# Patient Record
Sex: Male | Born: 1980 | Race: White | Hispanic: No | Marital: Married | Smoking: Never smoker
Health system: Southern US, Community
[De-identification: ages and names within clinical notes are randomized; demographics above are authoritative.]

## PROBLEM LIST (undated history)

## (undated) DIAGNOSIS — R011 Cardiac murmur, unspecified: Secondary | ICD-10-CM

## (undated) HISTORY — PX: HAND SURGERY: SHX662

## (undated) HISTORY — PX: APPENDECTOMY: SHX54

## (undated) HISTORY — PX: HAND TENDON SURGERY: SHX663

---

## 2005-08-24 ENCOUNTER — Emergency Department: Payer: Self-pay | Admitting: Unknown Physician Specialty

## 2006-11-18 ENCOUNTER — Emergency Department: Payer: Self-pay | Admitting: Unknown Physician Specialty

## 2007-09-06 ENCOUNTER — Emergency Department: Payer: Self-pay | Admitting: Emergency Medicine

## 2007-12-31 ENCOUNTER — Emergency Department: Payer: Self-pay | Admitting: Internal Medicine

## 2013-11-11 ENCOUNTER — Emergency Department: Payer: Self-pay | Admitting: Emergency Medicine

## 2014-01-22 ENCOUNTER — Emergency Department: Payer: Self-pay | Admitting: Emergency Medicine

## 2015-04-26 ENCOUNTER — Encounter: Payer: Self-pay | Admitting: Intensive Care

## 2015-04-26 ENCOUNTER — Emergency Department: Payer: Managed Care, Other (non HMO)

## 2015-04-26 ENCOUNTER — Emergency Department
Admission: EM | Admit: 2015-04-26 | Discharge: 2015-04-26 | Disposition: A | Discharge status: Home / Self Care | Payer: Managed Care, Other (non HMO) | Attending: Emergency Medicine | Admitting: Emergency Medicine

## 2015-04-26 DIAGNOSIS — Y9389 Activity, other specified: Secondary | ICD-10-CM | POA: Diagnosis not present

## 2015-04-26 DIAGNOSIS — Y9289 Other specified places as the place of occurrence of the external cause: Secondary | ICD-10-CM | POA: Insufficient documentation

## 2015-04-26 DIAGNOSIS — Y990 Civilian activity done for income or pay: Secondary | ICD-10-CM | POA: Diagnosis not present

## 2015-04-26 DIAGNOSIS — S6992XA Unspecified injury of left wrist, hand and finger(s), initial encounter: Secondary | ICD-10-CM | POA: Diagnosis present

## 2015-04-26 DIAGNOSIS — W109XXA Fall (on) (from) unspecified stairs and steps, initial encounter: Secondary | ICD-10-CM | POA: Insufficient documentation

## 2015-04-26 DIAGNOSIS — S60222A Contusion of left hand, initial encounter: Secondary | ICD-10-CM | POA: Insufficient documentation

## 2015-04-26 MED ORDER — IBUPROFEN 800 MG PO TABS: 800.0000 mg | ORAL_TABLET | Freq: Three times a day (TID) | ORAL | Status: DC | PRN

## 2015-04-26 NOTE — ED Notes (Signed)
Patient reports "I almost fell down the steps at work. I caught myself with my Left hand." Left hand is swollen with slight redness. Patient ambulated to rm with NAD noted

## 2015-04-26 NOTE — Discharge Instructions (Signed)
Hand Contusion  A hand contusion is a deep bruise on your hand area. Contusions are the result of an injury that caused bleeding under the skin. The contusion may turn blue, purple, or yellow. Minor injuries will give you a painless contusion, but more severe contusions may stay painful and swollen for a few weeks.  CAUSES   A contusion is usually caused by a blow, trauma, or direct force to an area of the body.  SYMPTOMS    Swelling and redness of the injured area.   Discoloration of the injured area.   Tenderness and soreness of the injured area.   Pain.  DIAGNOSIS   The diagnosis can be made by taking a history and performing a physical exam. An X-ray, CT scan, or MRI may be needed to determine if there were any associated injuries, such as broken bones (fractures).  TREATMENT   Often, the best treatment for a hand contusion is resting, elevating, icing, and applying cold compresses to the injured area. Over-the-counter medicines may also be recommended for pain control.  HOME CARE INSTRUCTIONS    Put ice on the injured area.    Put ice in a plastic bag.    Place a towel between your skin and the bag.    Leave the ice on for 15-20 minutes, 03-04 times a day.   Only take over-the-counter or prescription medicines as directed by your caregiver. Your caregiver may recommend avoiding anti-inflammatory medicines (aspirin, ibuprofen, and naproxen) for 48 hours because these medicines may increase bruising.   If told, use an elastic wrap as directed. This can help reduce swelling. You may remove the wrap for sleeping, showering, and bathing. If your fingers become numb, cold, or blue, take the wrap off and reapply it more loosely.   Elevate your hand with pillows to reduce swelling.   Avoid overusing your hand if it is painful.  SEEK IMMEDIATE MEDICAL CARE IF:    You have increased redness, swelling, or pain in your hand.   Your swelling or pain is not relieved with medicines.   You have loss of feeling in  your hand or are unable to move your fingers.   Your hand turns cold or blue.   You have pain when you move your fingers.   Your hand becomes warm to the touch.   Your contusion does not improve in 2 days.  MAKE SURE YOU:    Understand these instructions.   Will watch your condition.   Will get help right away if you are not doing well or get worse.     This information is not intended to replace advice given to you by your health care provider. Make sure you discuss any questions you have with your health care provider.     Document Released: 12/12/2001 Document Revised: 03/16/2012 Document Reviewed: 12/14/2011  Elsevier Interactive Patient Education 2016 Elsevier Inc[employer]

## 2015-04-26 NOTE — ED Provider Notes (Signed)
[facility] Emergency Department Provider Note  ____________________________________________  Time seen: Approximately 9:37 AM  I have reviewed the triage vital signs and the nursing notes.   HISTORY  Chief Complaint Hand Injury    HPI Riley Oconnell is a 34 y.o. male who presents for evaluation of left hand pain. Patient states that he almost fell down the steps at work caught his left hand on the railing. Complains of increased swelling and redness to the hand.   History reviewed. No pertinent past medical history.  There are no active problems to display for this patient.   Past Surgical History  Procedure Laterality Date  . Hand tendon surgery Right     Current Outpatient Rx  Name  Route  Sig  Dispense  Refill  . ibuprofen (ADVIL,MOTRIN) 800 MG tablet   Oral   Take 1 tablet (800 mg total) by mouth every 8 (eight) hours as needed.   30 tablet   0     Allergies Review of patient's allergies indicates no known allergies.  History reviewed. No pertinent family history.  Social History Social History  Substance Use Topics  . Smoking status: Never Smoker   . Smokeless tobacco: Never Used  . Alcohol Use: 3.6 oz/week    6 Cans of beer per week     Comment: mixture of beer and liquor. About two drinks everyother day    Review of Systems Constitutional: No fever/chills Eyes: No visual changes. ENT: No sore throat. Cardiovascular: Denies chest pain. Respiratory: Denies shortness of breath. Gastrointestinal: No abdominal pain.  No nausea, no vomiting.  No diarrhea.  No constipation. Genitourinary: Negative for dysuria. Musculoskeletal: Positive for left hand pain. Skin: Negative for rash. Neurological: Negative for headaches, focal weakness or numbness.  10-point ROS otherwise negative.  ____________________________________________   PHYSICAL EXAM:  VITAL SIGNS: ED Triage Vitals  Enc Vitals Group     BP 04/26/15 0923  128/84 mmHg     Pulse Rate 04/26/15 0923 72     Resp 04/26/15 0923 12     Temp 04/26/15 0923 98.6 F (37 C)     Temp Source 04/26/15 0923 Oral     SpO2 04/26/15 0923 97 %     Weight 04/26/15 0923 157 lb (71.215 kg)     Height 04/26/15 0923 5\' 9"  (1.753 m)     Head Cir --      Peak Flow --      Pain Score 04/26/15 0924 7     Pain Loc --      Pain Edu? --      Excl. in GC? --     Constitutional: Alert and oriented. Well appearing and in no acute distress. Musculoskeletal: Positive dorsal left hand edema with erythema noted. Point tenderness. Range of motion with decreased strength noted. Neurologic:  Normal speech and language. No gross focal neurologic deficits are appreciated. No gait instability. Skin:  Skin is warm, dry and intact. No rash noted. Psychiatric: Mood and affect are normal. Speech and behavior are normal.  ____________________________________________   LABS (all labs ordered are listed, but only abnormal results are displayed)  Labs Reviewed - No data to display  RADIOLOGY  Negative for fracture. Interpreted by radiologist and reviewed by myself. ____________________________________________   PROCEDURES  Procedure(s) performed: None  Critical Care performed: No  ____________________________________________   INITIAL IMPRESSION / ASSESSMENT AND PLAN / ED COURSE  Pertinent labs & imaging results that were available during my care of the patient  were reviewed by me and considered in my medical decision making (see chart for details).  Left hand contusion. Rx provided for Motrin 800 mg 3 times a day. Reassurance provided Ace wrap given. Patient follow-up PCP or return to ER with any worsening symptomology. ____________________________________________   FINAL CLINICAL IMPRESSION(S) / ED DIAGNOSES  Final diagnoses:  Hand contusion, left, initial encounter      Evangeline Dakin, PA-C 04/26/15 1021  Sharyn Creamer, MD 04/26/15 1546

## 2015-05-13 ENCOUNTER — Emergency Department
Admission: EM | Admit: 2015-05-13 | Discharge: 2015-05-13 | Disposition: A | Discharge status: Home / Self Care | Payer: Managed Care, Other (non HMO) | Attending: Emergency Medicine | Admitting: Emergency Medicine

## 2015-05-13 ENCOUNTER — Encounter: Payer: Self-pay | Admitting: Emergency Medicine

## 2015-05-13 ENCOUNTER — Emergency Department: Payer: Managed Care, Other (non HMO)

## 2015-05-13 DIAGNOSIS — M79642 Pain in left hand: Secondary | ICD-10-CM | POA: Diagnosis present

## 2015-05-13 MED ORDER — IBUPROFEN 600 MG PO TABS: ORAL_TABLET | ORAL | Status: DC

## 2015-05-13 NOTE — ED Notes (Addendum)
Pt seen here on 10/21 for hand pain resulting from injury the day before; pt says xray's were negative for fracture; returns tonight with continued pain; swelling noted to the back of the hand which pt says has improved from last visit; pt says he was prescribed ibuprofen for the pain and takes it "when it really hurts", but hasn't taken any since Saturday morning

## 2015-05-13 NOTE — Discharge Instructions (Signed)
Your repeat x-rays were reassuring and did not show any evidence of injury to the bones.  We recommend that you use your wrist splint as needed for comfort and follow up with a bone specialist to see if there are an additional treatment that may be benefit to you.  Use regular over-the-counter ibuprofen (600 mg) three times a day with meals, or use the prescribed ibuprofen.  You may also use over-the-counter Tylenol as needed according to the label instructions.   Musculoskeletal Pain Musculoskeletal pain is muscle and boney aches and pains. These pains can occur in any part of the body. Your caregiver may treat you without knowing the cause of the pain. They may treat you if blood or urine tests, X-rays, and other tests were normal.  CAUSES There is often not a definite cause or reason for these pains. These pains may be caused by a type of germ (virus). The discomfort may also come from overuse. Overuse includes working out too hard when your body is not fit. Boney aches also come from weather changes. Bone is sensitive to atmospheric pressure changes. HOME CARE INSTRUCTIONS   Ask when your test results will be ready. Make sure you get your test results.  Only take over-the-counter or prescription medicines for pain, discomfort, or fever as directed by your caregiver. If you were given medications for your condition, do not drive, operate machinery or power tools, or sign legal documents for 24 hours. Do not drink alcohol. Do not take sleeping pills or other medications that may interfere with treatment.  Continue all activities unless the activities cause more pain. When the pain lessens, slowly resume normal activities. Gradually increase the intensity and duration of the activities or exercise.  During periods of severe pain, bed rest may be helpful. Lay or sit in any position that is comfortable.  Putting ice on the injured area.  Put ice in a bag.  Place a towel between your skin and the  bag.  Leave the ice on for 15 to 20 minutes, 3 to 4 times a day.  Follow up with your caregiver for continued problems and no reason can be found for the pain. If the pain becomes worse or does not go away, it may be necessary to repeat tests or do additional testing. Your caregiver may need to look further for a possible cause. SEEK IMMEDIATE MEDICAL CARE IF:  You have pain that is getting worse and is not relieved by medications.  You develop chest pain that is associated with shortness or breath, sweating, feeling sick to your stomach (nauseous), or throw up (vomit).  Your pain becomes localized to the abdomen.  You develop any new symptoms that seem different or that concern you. MAKE SURE YOU:   Understand these instructions.  Will watch your condition.  Will get help right away if you are not doing well or get worse.   This information is not intended to replace advice given to you by your health care provider. Make sure you discuss any questions you have with your health care provider.   Document Released: 06/22/2005 Document Revised: 09/14/2011 Document Reviewed: 02/24/2013 Elsevier Interactive Patient Education [employer]2016 Elsevier Inc.

## 2015-05-13 NOTE — ED Provider Notes (Signed)
[facility] Emergency Department Provider Note  ____________________________________________  Time seen: Approximately 3:19 AM  I have reviewed the triage vital signs and the nursing notes.   HISTORY  Chief Complaint Hand Pain    HPI Riley Oconnell is a 34 y.o. male with no significant past medical history other than the hand injury which originally occurred about 2 weeks ago.  He originally presented the day after injuring his left hand and had negative x-rays and was prescribed ibuprofen which did help to relieve the pain.  However, it is been about 2 weeks and he is still having persistent pain with any movement of the hand.  It is slightly swollen around the area of his third metacarpal/MCP jointand he has significant discomfort with flexion and extension of his fingers.  He does not have any weakness.  He denies any other symptoms.  The pain is moderate and helped with ibuprofen.  He is right-hand dominant.   History reviewed. No pertinent past medical history.  There are no active problems to display for this patient.   Past Surgical History  Procedure Laterality Date  . Hand tendon surgery Right     Current Outpatient Rx  Name  Route  Sig  Dispense  Refill  . ibuprofen (ADVIL,MOTRIN) 600 MG tablet      Take 1 tablet by mouth three times daily with meals   15 tablet   0     Allergies Review of patient's allergies indicates no known allergies.  History reviewed. No pertinent family history.  Social History Social History  Substance Use Topics  . Smoking status: Never Smoker   . Smokeless tobacco: Never Used  . Alcohol Use: 3.6 oz/week    6 Cans of beer per week     Comment: mixture of beer and liquor. About two drinks everyother day    Review of Systems Constitutional: No fever/chills Eyes: No visual changes. ENT: No sore throat. Cardiovascular: Denies chest pain. Respiratory: Denies shortness of breath. Gastrointestinal: No  abdominal pain.  No nausea, no vomiting.  No diarrhea.  No constipation. Genitourinary: Negative for dysuria. Musculoskeletal: pain with use of left hand Neurological: Negative for headaches, focal weakness or numbness.  ____________________________________________   PHYSICAL EXAM:  VITAL SIGNS: ED Triage Vitals  Enc Vitals Group     BP 05/13/15 0212 120/91 mmHg     Pulse Rate 05/13/15 0212 65     Resp 05/13/15 0212 18     Temp 05/13/15 0212 98.2 F (36.8 C)     Temp Source 05/13/15 0212 Oral     SpO2 05/13/15 0212 99 %     Weight 05/13/15 0212 157 lb (71.215 kg)     Height 05/13/15 0212 5\' 9"  (1.753 m)     Head Cir --      Peak Flow --      Pain Score 05/13/15 0213 7     Pain Loc --      Pain Edu? --      Excl. in GC? --     Constitutional: Alert and oriented. Well appearing and in no acute distress. Eyes: Conjunctivae are normal. PERRL. EOMI. Head: Atraumatic. Cardiovascular: Normal rate, regular rhythm.  Respiratory: Normal respiratory effort.  No retractions.  Musculoskeletal: Swelling around the 3rd MCP of left hand w/ tenderness/pain w/ palpation and flexion/extension of middle finger Neurologic:  Normal speech and language. No gross focal neurologic deficits are appreciated.  Skin:  Skin is warm, dry and intact. No rash noted.  Psychiatric: Mood and affect are normal. Speech and behavior are normal.  ____________________________________________   LABS (all labs ordered are listed, but only abnormal results are displayed)  Labs Reviewed - No data to display ____________________________________________  EKG  Not indicated ____________________________________________  RADIOLOGY   Dg Hand Complete Left  05/13/2015  CLINICAL DATA:  Persistent pain about third metacarpal after injury 2 weeks prior. EXAM: LEFT HAND - COMPLETE 3+ VIEW COMPARISON:  Radiographs 04/26/2015 FINDINGS: Examination compared to prior exam. No acute or healing fracture, particularly of  the third metacarpal. No periosteal reaction. Questionable carpal bowel boss. The alignment and joint spaces are maintained. There is mild dorsal soft tissue edema. IMPRESSION: No acute or healing fracture, with particular attention to the third metacarpal. Electronically Signed   By: Rubye Oaks M.D.   On: 05/13/2015 03:42    ____________________________________________   PROCEDURES  Procedure(s) performed: None  Critical Care performed: No ____________________________________________   INITIAL IMPRESSION / ASSESSMENT AND PLAN / ED COURSE  Pertinent labs & imaging results that were available during my care of the patient were reviewed by me and considered in my medical decision making (see chart for details).  Will repeat x-rays given persistent pain and mild deformity to assess new bone growth that may indicate a subtle fracture not originally seen on radiographs immediately post-injury.  ____________________________________________  FINAL CLINICAL IMPRESSION(S) / ED DIAGNOSES  Final diagnoses:  Left hand pain      NEW MEDICATIONS STARTED DURING THIS VISIT:  New Prescriptions   IBUPROFEN (ADVIL,MOTRIN) 600 MG TABLET    Take 1 tablet by mouth three times daily with meals     Loleta Rose, MD 05/13/15 [phone]

## 2015-09-16 ENCOUNTER — Encounter: Payer: Self-pay | Admitting: Emergency Medicine

## 2015-09-16 ENCOUNTER — Emergency Department
Admission: EM | Admit: 2015-09-16 | Discharge: 2015-09-16 | Disposition: A | Discharge status: Home / Self Care | Payer: Managed Care, Other (non HMO) | Attending: Emergency Medicine | Admitting: Emergency Medicine

## 2015-09-16 DIAGNOSIS — R05 Cough: Secondary | ICD-10-CM | POA: Diagnosis present

## 2015-09-16 DIAGNOSIS — J069 Acute upper respiratory infection, unspecified: Secondary | ICD-10-CM | POA: Insufficient documentation

## 2015-09-16 DIAGNOSIS — Z791 Long term (current) use of non-steroidal anti-inflammatories (NSAID): Secondary | ICD-10-CM | POA: Diagnosis not present

## 2015-09-16 MED ORDER — CETIRIZINE HCL 10 MG PO TABS: 10.0000 mg | ORAL_TABLET | Freq: Every day | ORAL | Status: DC

## 2015-09-16 MED ORDER — FLUTICASONE PROPIONATE 50 MCG/ACT NA SUSP: 1.0000 | Freq: Two times a day (BID) | NASAL | Status: DC

## 2015-09-16 MED ORDER — MAGIC MOUTHWASH W/LIDOCAINE: 5.0000 mL | Freq: Four times a day (QID) | ORAL | Status: DC

## 2015-09-16 MED ORDER — BENZONATATE 200 MG PO CAPS: 200.0000 mg | ORAL_CAPSULE | Freq: Three times a day (TID) | ORAL | Status: DC | PRN

## 2015-09-16 NOTE — ED Notes (Signed)
Pt reports cough, body aches, congestion, "sniffles", fever of 100 over weekend, and sore throat.  No acute distress in triage.

## 2015-09-16 NOTE — ED Provider Notes (Signed)
[facility] Emergency Department Provider Note  ____________________________________________  Time seen: Approximately 11:14 PM  I have reviewed the triage vital signs and the nursing notes.   HISTORY  Chief Complaint Generalized Body Aches    HPI Riley Oconnell is a 35 y.o. male who presents emergency department complaining of upper respiratory symptoms to include cough, body aches, nasal congestion, fever, sore throat 4 days. Patient has been taking NyQuil with some relief. Patient denies any headache, visual acuity changes, neck pain, chest pain, shortness of breath, difficult breathing or swelling, abdominal pain, nausea or vomiting.   History reviewed. No pertinent past medical history.  There are no active problems to display for this patient.   Past Surgical History  Procedure Laterality Date  . Hand tendon surgery Right     Current Outpatient Rx  Name  Route  Sig  Dispense  Refill  . benzonatate (TESSALON) 200 MG capsule   Oral   Take 1 capsule (200 mg total) by mouth 3 (three) times daily as needed for cough.   21 capsule   0   . cetirizine (ZYRTEC) 10 MG tablet   Oral   Take 1 tablet (10 mg total) by mouth daily.   30 tablet   0   . fluticasone (FLONASE) 50 MCG/ACT nasal spray   Each Nare   Place 1 spray into both nostrils 2 (two) times daily.   16 g   0   . ibuprofen (ADVIL,MOTRIN) 600 MG tablet      Take 1 tablet by mouth three times daily with meals   15 tablet   0   . magic mouthwash w/lidocaine SOLN   Oral   Take 5 mLs by mouth 4 (four) times daily.   240 mL   0     Dispense in a 1/1/1/1 ratio. Use lidocaine, diphen ...     Allergies Review of patient's allergies indicates no known allergies.  History reviewed. No pertinent family history.  Social History Social History  Substance Use Topics  . Smoking status: Never Smoker   . Smokeless tobacco: Never Used  . Alcohol Use: 3.6 oz/week    6 Cans of  beer per week     Comment: mixture of beer and liquor. About two drinks everyother day     Review of Systems  Constitutional: Positive fever/chills Eyes: No visual changes. No discharge ENT: Positive sore throat. Does admit nasal congestion. Cardiovascular: no chest pain. Respiratory: Positive cough. No SOB. Skin: Negative for rash. Neurological: Negative for headaches, focal weakness or numbness. 10-point ROS otherwise negative.  ____________________________________________   PHYSICAL EXAM:  VITAL SIGNS: ED Triage Vitals  Enc Vitals Group     BP 09/16/15 2125 139/80 mmHg     Pulse Rate 09/16/15 2125 78     Resp 09/16/15 2125 16     Temp 09/16/15 2125 98.4 F (36.9 C)     Temp Source 09/16/15 2125 Oral     SpO2 09/16/15 2125 97 %     Weight 09/16/15 2125 155 lb (70.308 kg)     Height 09/16/15 2125  (1.753 m)     Head Cir --      Peak Flow --      Pain Score 09/16/15 2125 7     Pain Loc --      Pain Edu? --      Excl. in GC? --      Constitutional: Alert and oriented. Well appearing and in no acute  distress. Eyes: Conjunctivae are normal. PERRL. EOMI. Head: Atraumatic. ENT:      Ears: EACs and TMs are unremarkable bilaterally.      Nose: Moderate clear congestion/rhinnorhea.      Mouth/Throat: Mucous membranes are moist. Oropharynx is nonerythematous and nonedematous. Tonsils are mildly erythematous but nonedematous and no exudates present. Neck: No stridor.   Hematological/Lymphatic/Immunilogical: No cervical lymphadenopathy. Cardiovascular: Normal rate, regular rhythm. Normal S1 and S2.  Good peripheral circulation. Respiratory: Normal respiratory effort without tachypnea or retractions. Lungs CTAB. Neurologic:  Normal speech and language. No gross focal neurologic deficits are appreciated.  Skin:  Skin is warm, dry and intact. No rash noted. Psychiatric: Mood and affect are normal. Speech and behavior are normal. Patient exhibits appropriate insight and  judgement.   ____________________________________________   LABS (all labs ordered are listed, but only abnormal results are displayed)  Labs Reviewed - No data to display ____________________________________________  EKG   ____________________________________________  RADIOLOGY   No results found.  ____________________________________________    PROCEDURES  Procedure(s) performed:       Medications - No data to display   ____________________________________________   INITIAL IMPRESSION / ASSESSMENT AND PLAN / ED COURSE  Pertinent labs & imaging results that were available during my care of the patient were reviewed by me and considered in my medical decision making (see chart for details).  Patient's diagnosis is consistent with prior lower respiratory illness.. Patient will be discharged home with prescriptions for symptom control. Patient is to follow up with primary care provider if symptoms persist past this treatment course. Patient is given ED precautions to return to the ED for any worsening or new symptoms.     ____________________________________________  FINAL CLINICAL IMPRESSION(S) / ED DIAGNOSES  Final diagnoses:  Viral upper respiratory illness      NEW MEDICATIONS STARTED DURING THIS VISIT:  Discharge Medication List as of 09/16/2015 11:26 PM    START taking these medications   Details  benzonatate (TESSALON) 200 MG capsule Take 1 capsule (200 mg total) by mouth 3 (three) times daily as needed for cough., Starting 09/16/2015, Until Discontinued, Print    cetirizine (ZYRTEC) 10 MG tablet Take 1 tablet (10 mg total) by mouth daily., Starting 09/16/2015, Until Discontinued, Print    fluticasone (FLONASE) 50 MCG/ACT nasal spray Place 1 spray into both nostrils 2 (two) times daily., Starting 09/16/2015, Until Discontinued, Print    magic mouthwash w/lidocaine SOLN Take 5 mLs by mouth 4 (four) times daily., Starting 09/16/2015, Until  Discontinued, Print            This chart was dictated using voice recognition software/Dragon. Despite best efforts to proofread, errors can occur which can change the meaning. Any change was purely unintentional.    Racheal PatchesJonathan D Cuthriell, PA-C 09/17/15 0004  Arnaldo NatalPaul F Malinda, MD 09/17/15 [phone]

## 2015-09-16 NOTE — Discharge Instructions (Signed)
Upper Respiratory Infection, Adult Most upper respiratory infections (URIs) are a viral infection of the air passages leading to the lungs. A URI affects the nose, throat, and upper air passages. The most common type of URI is nasopharyngitis and is typically referred to as "the common cold." URIs run their course and usually go away on their own. Most of the time, a URI does not require medical attention, but sometimes a bacterial infection in the upper airways can follow a viral infection. This is called a secondary infection. Sinus and middle ear infections are common types of secondary upper respiratory infections. Bacterial pneumonia can also complicate a URI. A URI can worsen asthma and chronic obstructive pulmonary disease (COPD). Sometimes, these complications can require emergency medical care and may be life threatening.  CAUSES Almost all URIs are caused by viruses. A virus is a type of germ and can spread from one person to another.  RISKS FACTORS You may be at risk for a URI if:   You smoke.   You have chronic heart or lung disease.  You have a weakened defense (immune) system.   You are very young or very old.   You have nasal allergies or asthma.  You work in crowded or poorly ventilated areas.  You work in health care facilities or schools. SIGNS AND SYMPTOMS  Symptoms typically develop 2-3 days after you come in contact with a cold virus. Most viral URIs last 7-10 days. However, viral URIs from the influenza virus (flu virus) can last 14-18 days and are typically more severe. Symptoms may include:   Runny or stuffy (congested) nose.   Sneezing.   Cough.   Sore throat.   Headache.   Fatigue.   Fever.   Loss of appetite.   Pain in your forehead, behind your eyes, and over your cheekbones (sinus pain).  Muscle aches.  DIAGNOSIS  Your health care provider may diagnose a URI by:  Physical exam.  Tests to check that your symptoms are not due to  another condition such as:  Strep throat.  Sinusitis.  Pneumonia.  Asthma. TREATMENT  A URI goes away on its own with time. It cannot be cured with medicines, but medicines may be prescribed or recommended to relieve symptoms. Medicines may help:  Reduce your fever.  Reduce your cough.  Relieve nasal congestion. HOME CARE INSTRUCTIONS   Take medicines only as directed by your health care provider.   Gargle warm saltwater or take cough drops to comfort your throat as directed by your health care provider.  Use a warm mist humidifier or inhale steam from a shower to increase air moisture. This may make it easier to breathe.  Drink enough fluid to keep your urine clear or pale yellow.   Eat soups and other clear broths and maintain good nutrition.   Rest as needed.   Return to work when your temperature has returned to normal or as your health care provider advises. You may need to stay home longer to avoid infecting others. You can also use a face mask and careful hand washing to prevent spread of the virus.  Increase the usage of your inhaler if you have asthma.   Do not use any tobacco products, including cigarettes, chewing tobacco, or electronic cigarettes. If you need help quitting, ask your health care provider. PREVENTION  The best way to protect yourself from getting a cold is to practice good hygiene.   Avoid oral or hand contact with people with cold   symptoms.   Wash your hands often if contact occurs.  There is no clear evidence that vitamin C, vitamin E, echinacea, or exercise reduces the chance of developing a cold. However, it is always recommended to get plenty of rest, exercise, and practice good nutrition.  SEEK MEDICAL CARE IF:   You are getting worse rather than better.   Your symptoms are not controlled by medicine.   You have chills.  You have worsening shortness of breath.  You have brown or red mucus.  You have yellow or brown nasal  discharge.  You have pain in your face, especially when you bend forward.  You have a fever.  You have swollen neck glands.  You have pain while swallowing.  You have white areas in the back of your throat. SEEK IMMEDIATE MEDICAL CARE IF:   You have severe or persistent:  Headache.  Ear pain.  Sinus pain.  Chest pain.  You have chronic lung disease and any of the following:  Wheezing.  Prolonged cough.  Coughing up blood.  A change in your usual mucus.  You have a stiff neck.  You have changes in your:  Vision.  Hearing.  Thinking.  Mood. MAKE SURE YOU:   Understand these instructions.  Will watch your condition.  Will get help right away if you are not doing well or get worse.   This information is not intended to replace advice given to you by your health care provider. Make sure you discuss any questions you have with your health care provider.   Document Released: 12/16/2000 Document Revised: 11/06/2014 Document Reviewed: 09/27/2013 Elsevier Interactive Patient Education 2016 [employer]  

## 2015-11-15 ENCOUNTER — Encounter: Payer: Self-pay | Admitting: Medical Oncology

## 2015-11-15 ENCOUNTER — Emergency Department
Admission: EM | Admit: 2015-11-15 | Discharge: 2015-11-15 | Disposition: A | Discharge status: Home / Self Care | Payer: Managed Care, Other (non HMO) | Attending: Emergency Medicine | Admitting: Emergency Medicine

## 2015-11-15 DIAGNOSIS — A6002 Herpesviral infection of other male genital organs: Secondary | ICD-10-CM | POA: Diagnosis not present

## 2015-11-15 DIAGNOSIS — Z79899 Other long term (current) drug therapy: Secondary | ICD-10-CM | POA: Diagnosis not present

## 2015-11-15 DIAGNOSIS — R21 Rash and other nonspecific skin eruption: Secondary | ICD-10-CM | POA: Diagnosis present

## 2015-11-15 DIAGNOSIS — Z791 Long term (current) use of non-steroidal anti-inflammatories (NSAID): Secondary | ICD-10-CM | POA: Insufficient documentation

## 2015-11-15 DIAGNOSIS — A6000 Herpesviral infection of urogenital system, unspecified: Secondary | ICD-10-CM

## 2015-11-15 MED ORDER — VALACYCLOVIR HCL 1 G PO TABS: 1000.0000 mg | ORAL_TABLET | Freq: Three times a day (TID) | ORAL | Status: DC

## 2015-11-15 MED ORDER — TRIAMCINOLONE ACETONIDE 0.1 % EX CREA
1.0000 | TOPICAL_CREAM | Freq: Two times a day (BID) | CUTANEOUS | Status: DC
Start: 2015-11-15 — End: 2018-11-01

## 2015-11-15 NOTE — ED Notes (Signed)
Rash around rectum x 3 days.

## 2015-11-15 NOTE — ED Provider Notes (Signed)
[facility] Emergency Department Provider Note  ____________________________________________  Time seen: Approximately 8:14 AM  I have reviewed the triage vital signs and the nursing notes.   HISTORY  Chief Complaint Rash    HPI Riley Oconnell is a 35 y.o. male , NAD, presents to emergency for 3 day history of painful rash on his buttocks and rectum.States he had unprotected anal intercourse with another male approximately 2 weeks ago. States he was inebriated and uncertain if condoms were used. Noted red, painful rash about his rectum and buttocks over the last few days. Has not noted any oozing or weeping. Denies fever, chills, night sweats, body aches, diarrhea, abdominal pain, nausea, vomiting. Applied over-the-counter hemorrhoid cream which seemed to soothe some of the area but did not alleviate his symptoms. No new lotions, soaps, detergents. He recently restarted working out in a gym as well as tanning but notes no other rash about the rest of this body.   History reviewed. No pertinent past medical history.  There are no active problems to display for this patient.   Past Surgical History  Procedure Laterality Date  . Hand tendon surgery Right     Current Outpatient Rx  Name  Route  Sig  Dispense  Refill  . benzonatate (TESSALON) 200 MG capsule   Oral   Take 1 capsule (200 mg total) by mouth 3 (three) times daily as needed for cough.   21 capsule   0   . cetirizine (ZYRTEC) 10 MG tablet   Oral   Take 1 tablet (10 mg total) by mouth daily.   30 tablet   0   . fluticasone (FLONASE) 50 MCG/ACT nasal spray   Each Nare   Place 1 spray into both nostrils 2 (two) times daily.   16 g   0   . ibuprofen (ADVIL,MOTRIN) 600 MG tablet      Take 1 tablet by mouth three times daily with meals   15 tablet   0   . magic mouthwash w/lidocaine SOLN   Oral   Take 5 mLs by mouth 4 (four) times daily.   240 mL   0     Dispense in a 1/1/1/1  ratio. Use lidocaine, diphen ...   . triamcinolone cream (KENALOG) 0.1 %   Topical   Apply 1 application topically 2 (two) times daily.   30 g   0   . valACYclovir (VALTREX) 1000 MG tablet   Oral   Take 1 tablet (1,000 mg total) by mouth 3 (three) times daily.   30 tablet   0     Allergies Review of patient's allergies indicates no known allergies.  No family history on file.  Social History Social History  Substance Use Topics  . Smoking status: Never Smoker   . Smokeless tobacco: Never Used  . Alcohol Use: No     Comment: occasionan     Review of Systems  Constitutional: No fever, chills, fatigue, night sweats ENT: No sore throat. Cardiovascular: No chest pain. Respiratory: No shortness of breath.  Gastrointestinal: No abdominal pain.  No nausea, vomiting.  No diarrhea.  No constipation. No rectal pain Genitourinary: Negative for dysuria, urethral discharge Musculoskeletal: Negative for back pain.  Skin: Positive painful erythematous rash about the rectum and buttocks. No oozing, weeping.  Neurological: Negative for headaches, focal weakness or numbness. No saddle paresthesias. No loss of bowel or bladder control. 10-point ROS otherwise negative.  ____________________________________________   PHYSICAL EXAM:  VITAL SIGNS:  ED Triage Vitals  Enc Vitals Group     BP 11/15/15 0758 130/93 mmHg     Pulse Rate 11/15/15 0758 60     Resp 11/15/15 0758 18     Temp 11/15/15 0758 98.2 F (36.8 C)     Temp Source 11/15/15 0758 Oral     SpO2 11/15/15 0758 97 %     Weight 11/15/15 0758 155 lb (70.308 kg)     Height 11/15/15 0758 5\' 9"  (1.753 m)     Head Cir --      Peak Flow --      Pain Score 11/15/15 0759 7     Pain Loc --      Pain Edu? --      Excl. in GC? --    Physical exam completed in the presence of Marliss CzarLinda McLamb, RN.  Constitutional: Alert and oriented. Well appearing and in no acute distress. Eyes: Conjunctivae are normal.  Head:  Atraumatic. Cardiovascular:  Good peripheral circulation. Respiratory: Normal respiratory effort without tachypnea or retractions. Gastrointestinal: Multiple erythematous papules noted about the anus and perianal region. One open lesion is noted. No tenderness to palpation. No evidence of induration of any of the lesions. No evidence of external hemorrhoids. Musculoskeletal: No lower extremity tenderness nor edema.  No joint effusions. Neurologic:  Normal speech and language.  Skin:  Skin is warm, dry and intact.  Psychiatric: Mood and affect are normal. Speech and behavior are normal. Patient exhibits appropriate insight and judgement.   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 Patient will be discharged home with prescriptions for Valtrex to take as directed and triamcinolone cream to apply for symptomatic care. [facility] to [facility] or his primary care provider if symptoms persist past this treatment course. Patient also advised to have complete physical examination and STD workup with his primary care provider or the [facility]. Patient is given ED precautions to return to the ED for any worsening or new symptoms.      ____________________________________________  FINAL CLINICAL IMPRESSION(S) / ED DIAGNOSES  Final diagnoses:  Genital herpes      NEW MEDICATIONS STARTED DURING THIS VISIT:  New Prescriptions   TRIAMCINOLONE CREAM (KENALOG) 0.1 %    Apply 1 application  topically 2 (two) times daily.   VALACYCLOVIR (VALTREX) 1000 MG TABLET    Take 1 tablet (1,000 mg total) by mouth 3 (three) times daily.         Hope PigeonJami L Hagler, PA-C 11/15/15 [id number]  Jennye MoccasinBrian S Quigley, MD 11/15/15 1043

## 2015-11-15 NOTE — Discharge Instructions (Signed)

## 2015-11-15 NOTE — ED Notes (Signed)
States he developed several small bumps/rash on buttocks around the rectum..Marland Kitchen

## 2016-01-27 ENCOUNTER — Encounter: Payer: Self-pay | Admitting: Emergency Medicine

## 2016-01-27 DIAGNOSIS — Z79899 Other long term (current) drug therapy: Secondary | ICD-10-CM | POA: Diagnosis not present

## 2016-01-27 DIAGNOSIS — L03211 Cellulitis of face: Secondary | ICD-10-CM | POA: Insufficient documentation

## 2016-01-27 DIAGNOSIS — Z791 Long term (current) use of non-steroidal anti-inflammatories (NSAID): Secondary | ICD-10-CM | POA: Insufficient documentation

## 2016-01-27 DIAGNOSIS — Z7951 Long term (current) use of inhaled steroids: Secondary | ICD-10-CM | POA: Diagnosis not present

## 2016-01-27 NOTE — ED Triage Notes (Addendum)
Patient ambulatory to triage with steady gait, without difficulty or distress noted; pt reports facial swelling to lower jaw since this am; none noted at this time

## 2016-01-28 ENCOUNTER — Emergency Department
Admission: EM | Admit: 2016-01-28 | Discharge: 2016-01-28 | Disposition: A | Discharge status: Home / Self Care | Payer: Managed Care, Other (non HMO) | Attending: Emergency Medicine | Admitting: Emergency Medicine

## 2016-01-28 DIAGNOSIS — L03211 Cellulitis of face: Secondary | ICD-10-CM

## 2016-01-28 MED ORDER — CLINDAMYCIN HCL 150 MG PO CAPS: 450.0000 mg | ORAL_CAPSULE | Freq: Three times a day (TID) | ORAL | 0 refills | Status: DC

## 2016-01-28 NOTE — ED Provider Notes (Signed)
[facility] Emergency Department Provider Note  ____________________________________________  Time seen: Approximately 2:16 AM  I have reviewed the triage vital signs and the nursing notes.   HISTORY  Chief Complaint No chief complaint on file.    HPI Riley Oconnell is a 35 y.o. male who reports left facial swelling for The past 24 hours.He reports that it started when he accidentally bit the inside of his left lower lip and cheek area, and then it started to swell and he inadvertently admitted multiple more times due to the swelling. Today he felt like he is able to express a small amount of purulent fluid from the wound. Denies any difficulty swallowing. Eating normally. No neck pain or trouble breathing. No fevers or chills sore throat headache vision changes or dizziness.     History reviewed. No pertinent past medical history.   There are no active problems to display for this patient.    Past Surgical History:  Procedure Laterality Date  . HAND TENDON SURGERY Right      Current Outpatient Rx  . Order #: [id number] Class: Print  . Order #: [id number] Class: Print  . Order #: [id number] Class: Print  . Order #: [id number] Class: Print  . Order #: [id number] Class: Print  . Order #: [id number] Class: Print  . Order #: [id number] Class: Print  . Order #: [id number] Class: Print     Allergies Review of patient's allergies indicates no known allergies.   No family history on file.  Social History Social History  Substance Use Topics  . Smoking status: Never Smoker  . Smokeless tobacco: Never Used  . Alcohol use No     Comment: occasionan    Review of Systems  Constitutional:   No fever or chills.  ENT:   No sore throat. No rhinorrhea.Left facial swelling Cardiovascular:   No chest pain. Respiratory:   No dyspnea or cough.  Musculoskeletal:   Negative for focal pain or swelling Neurological:   Negative for headaches 10-point ROS  otherwise negative.  ____________________________________________   PHYSICAL EXAM:  VITAL SIGNS: ED Triage Vitals [01/27/16 2223]  Enc Vitals Group     BP 129/87     Pulse Rate (!) 52     Resp 18     Temp 98.1 F (36.7 C)     Temp Source Oral     SpO2 97 %     Weight 155 lb (70.3 kg)     Height  (1.753 m)     Head Circumference      Peak Flow      Pain Score 6     Pain Loc      Pain Edu?      Excl. in GC?     Vital signs reviewed, nursing assessments reviewed.   Constitutional:   Alert and oriented. Well appearing and in no distress. Eyes:   No scleral icterus. No conjunctival pallor.  EOMI.  No nystagmus. ENT   Head:   Normocephalic and atraumatic.   Nose:   No congestion/rhinnorhea. No septal hematoma   Mouth/Throat:   MMM, no pharyngeal erythema. No peritonsillar mass. The buccal surface of the left corner of the mouth along the lower lip is macerated with granulation tissue consistent with subacute and somewhat chronic soft tissue injury from mastication. There is tenderness and thickening of this area without any fluctuance or purulent drainage. Externally, the left lower lip and cheek area does appear slightly swollen although not grossly erythematous warm or indurated.   Neck:  No stridor. No SubQ emphysema. No meningismus. Hematological/Lymphatic/Immunilogical:   No cervical lymphadenopathy. Cardiovascular:   RRR. Symmetric bilateral radial and DP pulses.  No murmurs.  Respiratory:   Normal respiratory effort without tachypnea nor retractions. Breath sounds are clear and equal bilaterally. No wheezes/rales/rhonchi. Gastrointestinal:   Soft and nontender. Non distended. There is no CVA tenderness.  No rebound, rigidity, or guarding. Genitourinary:   deferred  Neurologic:   Normal speech and language.  CN 2-10 normal. Motor grossly intact. No gross focal neurologic deficits are appreciated.    ____________________________________________     LABS (pertinent positives/negatives) (all labs ordered are listed, but only abnormal results are displayed) Labs Reviewed - No data to display ____________________________________________   EKG    ____________________________________________    RADIOLOGY    ____________________________________________   PROCEDURES Procedures  ____________________________________________   INITIAL IMPRESSION / ASSESSMENT AND PLAN / ED COURSE  Pertinent labs & imaging results that were available during my care of the patient were reviewed by me and considered in my medical decision making (see chart for details).  Patient presents with swelling of the left corner of the mouth due to recurrent trauma from inadvertent biting. Appears to have a mild cellulitis in the area, not consistent with Ludwig angina or any kind of abscess formation. No airway compromise, no evidence of any extension to sensitive structures around the airway or the central nervous system. Sclerae well-appearing, comfortable. I'll have him take clindamycin and follow up with primary care.     Clinical Course   ____________________________________________   FINAL CLINICAL IMPRESSION(S) / ED DIAGNOSES  Final diagnoses:  Facial cellulitis       Portions of this note were generated with dragon dictation software. Dictation errors may occur despite best attempts at proofreading.    Sharman Cheek, MD 01/28/16 Earle Gell

## 2016-01-28 NOTE — ED Notes (Signed)
Patient states that he bit the inside of his gum last night and that it started swelling. Patient reports that he has bit his gum 5 more times since then. Patient reports that the swelling has gone down some but he still has some swelling.

## 2018-01-29 ENCOUNTER — Emergency Department
Admission: EM | Admit: 2018-01-29 | Discharge: 2018-01-29 | Disposition: A | Discharge status: Home / Self Care | Payer: Managed Care, Other (non HMO) | Attending: Emergency Medicine | Admitting: Emergency Medicine

## 2018-01-29 DIAGNOSIS — H5711 Ocular pain, right eye: Secondary | ICD-10-CM | POA: Insufficient documentation

## 2018-01-29 DIAGNOSIS — H579 Unspecified disorder of eye and adnexa: Secondary | ICD-10-CM | POA: Insufficient documentation

## 2018-01-29 DIAGNOSIS — Z79899 Other long term (current) drug therapy: Secondary | ICD-10-CM | POA: Insufficient documentation

## 2018-01-29 MED ORDER — ERYTHROMYCIN 5 MG/GM OP OINT: 1.0000 "application " | TOPICAL_OINTMENT | Freq: Three times a day (TID) | OPHTHALMIC | 0 refills | Status: DC

## 2018-01-29 MED ORDER — FLUORESCEIN SODIUM 1 MG OP STRP
1.0000 | ORAL_STRIP | Freq: Once | OPHTHALMIC | Status: AC
  Administered 2018-01-29: 1 via OPHTHALMIC
  Filled 2018-01-29: qty 1

## 2018-01-29 MED ORDER — TETRACAINE HCL 0.5 % OP SOLN
1.0000 [drp] | Freq: Once | OPHTHALMIC | Status: AC
  Administered 2018-01-29: 1 [drp] via OPHTHALMIC
  Filled 2018-01-29: qty 4

## 2018-01-29 MED ORDER — ERYTHROMYCIN 5 MG/GM OP OINT
TOPICAL_OINTMENT | Freq: Once | OPHTHALMIC | Status: AC
  Administered 2018-01-29: 1 via OPHTHALMIC
  Filled 2018-01-29: qty 1

## 2018-01-29 NOTE — ED Triage Notes (Signed)
Patient c/o possible foreign body to right eye, eye pain, and redness beginning yesterday.

## 2018-01-29 NOTE — ED Provider Notes (Signed)
[facility] Emergency Department Provider Note   ____________________________________________   First MD Initiated Contact with Patient 01/29/18 0510     (approximate)  I have reviewed the triage vital signs and the nursing notes.   HISTORY  Chief Complaint Eye Injury    HPI Riley Oconnell is a 37 y.o. male who comes into the hospital today stating that something is lodged in his right eye.  He reports that he cannot get it out.  Every time he blinks or moves his eye it hurts.  The patient was at work when he thinks something flew into his eye.  He states that he did try to wash it out for about 15 minutes but he did not get anything out.  The patient states that his vision is blurry occasionally and his pain is a 6-7 out of 10 in intensity.  The patient came into the hospital today for evaluation.   History reviewed. No pertinent past medical history.  There are no active problems to display for this patient.   Past Surgical History:  Procedure Laterality Date  . HAND TENDON SURGERY Right     Prior to Admission medications   Medication Sig Start Date End Date Taking? Authorizing Provider  benzonatate (TESSALON) 200 MG capsule Take 1 capsule (200 mg total) by mouth 3 (three) times daily as needed for cough. 09/16/15   Cuthriell, Delorise Royals, PA-C  cetirizine (ZYRTEC) 10 MG tablet Take 1 tablet (10 mg total) by mouth daily. 09/16/15   Cuthriell, Delorise Royals, PA-C  clindamycin (CLEOCIN) 150 MG capsule Take 3 capsules (450 mg total) by mouth 3 (three) times daily. 01/28/16   Sharman Cheek, MD  erythromycin ophthalmic ointment Place 1 application into the right eye 3 (three) times daily. 3 days 01/29/18   Rebecka Apley, MD  fluticasone [facility]) 50 MCG/ACT nasal spray Place 1 spray into both nostrils 2 (two) times daily. 09/16/15   Cuthriell, Delorise Royals, PA-C  ibuprofen (ADVIL,MOTRIN) 600 MG tablet Take 1 tablet by mouth three times daily with meals  05/13/15   Loleta Rose, MD  magic mouthwash w/lidocaine SOLN Take 5 mLs by mouth 4 (four) times daily. 09/16/15   Cuthriell, Delorise Royals, PA-C  triamcinolone cream (KENALOG) 0.1 % Apply 1 application topically 2 (two) times daily. 11/15/15   Hagler, Jami L, PA-C  valACYclovir (VALTREX) 1000 MG tablet Take 1 tablet (1,000 mg total) by mouth 3 (three) times daily. 11/15/15   Hagler, Jami L, PA-C    Allergies Patient has no known allergies.  No family history on file.  Social History Social History   Tobacco Use  . Smoking status: Never Smoker  . Smokeless tobacco: Never Used  Substance Use Topics  . Alcohol use: Yes    Alcohol/week: 3.6 oz    Types: 6 Cans of beer per week    Comment: occasional  . Drug use: No    Review of Systems  Constitutional: No fever/chills Eyes: Right eye pain and blurred vision ENT: No sore throat. Cardiovascular: Denies chest pain. Respiratory: Denies shortness of breath. Gastrointestinal: No abdominal pain.  No nausea, no vomiting.  No diarrhea.  No constipation. Genitourinary: Negative for dysuria. Musculoskeletal: Negative for back pain. Skin: Negative for rash. Neurological: Negative for headaches, focal weakness or numbness.   ____________________________________________   PHYSICAL EXAM:  VITAL SIGNS: ED Triage Vitals  Enc Vitals Group     BP 01/29/18 0421 119/76     Pulse Rate 01/29/18 0421 (!) 56  Resp 01/29/18 0421 15     Temp 01/29/18 0421 98.2 F (36.8 C)     Temp Source 01/29/18 0421 Oral     SpO2 01/29/18 0421 98 %     Weight 01/29/18 0420 150 lb (68 kg)     Height 01/29/18 0420 5\' 9"  (1.753 m)     Head Circumference --      Peak Flow --      Pain Score 01/29/18 0419 7     Pain Loc --      Pain Edu? --      Excl. in GC? --     Constitutional: Alert and oriented. Well appearing and in no acute distress. Eyes: Conjunctivae are normal. PERRL. EOMI. no foreign body visualized in the right eye.  There is some scleral  injection.  No uptake seen on fluorescein exam vision is 20/25 in the right eye and 20/30 in the left eye. Head: Atraumatic. Nose: No congestion/rhinnorhea. Mouth/Throat: Mucous membranes are moist.  Oropharynx non-erythematous. Cardiovascular: Normal rate, regular rhythm.  Respiratory: Normal respiratory effort.  No retractions. Lungs CTAB. Gastrointestinal: Soft and nontender. No distention.  Musculoskeletal: No lower extremity tenderness nor edema.   Neurologic:  Normal speech and language.  Skin:  Skin is warm, dry and intact. Psychiatric: Mood and affect are normal.   ____________________________________________   LABS (all labs ordered are listed, but only abnormal results are displayed)  Labs Reviewed - No data to display ____________________________________________  EKG  none ____________________________________________  RADIOLOGY  ED MD interpretation:  none  Official radiology report(s): No results found.  ____________________________________________   PROCEDURES  Procedure(s) performed: None  Procedures  Critical Care performed: No  ____________________________________________   INITIAL IMPRESSION / ASSESSMENT AND PLAN / ED COURSE  As part of my medical decision making, I reviewed the following data within the electronic MEDICAL RECORD NUMBER Notes from prior ED visits and [place] Controlled Substance Database   This is a 37 year old male who comes into the hospital today stating that something is in his right eye.  The patient states that he was at work when something went into his eye.  I am not seeing any foreign body in the patient's eye on exam.  Given his complaint we did place a Morgan lens and irrigated the patient's eye with about 500 mL's of normal saline.  We then placed some erythromycin eye ointment to the patient's right eye.  He will be discharged home and encouraged to follow-up with ophthalmology.       ____________________________________________   FINAL CLINICAL IMPRESSION(S) / ED DIAGNOSES  Final diagnoses:  Pain of right eye  Sensation of foreign body in eye     ED Discharge Orders        Ordered    erythromycin ophthalmic ointment  3 times daily     01/29/18 [id number]       Note:  This document was prepared using Dragon voice recognition software and may include unintentional dictation errors.    Rebecka ApleyWebster, Jeremian Whitby P, MD 01/29/18 [phone]

## 2018-01-29 NOTE — Discharge Instructions (Signed)
Please follow-up with the ophthalmologist.  Please use the erythromycin eye ointment.  Please return with any worsening symptoms or any other concerns.

## 2018-11-01 ENCOUNTER — Other Ambulatory Visit: Payer: Self-pay

## 2018-11-01 ENCOUNTER — Encounter: Payer: Self-pay | Admitting: Emergency Medicine

## 2018-11-01 ENCOUNTER — Ambulatory Visit
Admission: EM | Admit: 2018-11-01 | Discharge: 2018-11-01 | Disposition: A | Discharge status: Home / Self Care | Payer: BLUE CROSS/BLUE SHIELD | Attending: Family Medicine | Admitting: Family Medicine

## 2018-11-01 DIAGNOSIS — M67911 Unspecified disorder of synovium and tendon, right shoulder: Secondary | ICD-10-CM

## 2018-11-01 DIAGNOSIS — M25511 Pain in right shoulder: Secondary | ICD-10-CM | POA: Diagnosis not present

## 2018-11-01 DIAGNOSIS — G8929 Other chronic pain: Secondary | ICD-10-CM | POA: Diagnosis not present

## 2018-11-01 MED ORDER — MELOXICAM 15 MG PO TABS: 15.0000 mg | ORAL_TABLET | Freq: Every day | ORAL | 0 refills | Status: DC | PRN

## 2018-11-01 NOTE — Discharge Instructions (Addendum)
Exercises daily.  Medication as directed.  If persists, see [facility] or [facility].  Take care  Dr. Adriana Simas

## 2018-11-01 NOTE — ED Triage Notes (Signed)
Patient states he thinks he hyperextended his right shoulder.  He states it is getting weaker daily

## 2018-11-01 NOTE — ED Provider Notes (Signed)
[facility]    CSN: [id number] Arrival date & time: 11/01/18  1540  History   Chief Complaint Chief Complaint  Patient presents with  . Shoulder Pain   HPI  38 year old male presents with shoulder pain.  Chronic problem.  Has been going on for the past 5 months.  Localizes the pain to the lateral right shoulder.  Patient feels that he is having decreased range of motion particularly with abduction.  He states that it occurred after he was using a machine at work.  He states that he was "changing it over".  Patient reports that he has used some Tylenol infrequently without resolution.  Seem to occur with certain activities.  No relieving factors.  His pain is mild currently.  No radicular symptoms.  No other associated symptoms.  No other complaints.  PMH, Surgical Hx, Family Hx, Social History reviewed and updated as below. No significant PMH.  Past Surgical History:  Procedure Laterality Date  . HAND TENDON SURGERY Right    Home Medications    Prior to Admission medications   Medication Sig Start Date End Date Taking? Authorizing Provider  meloxicam (MOBIC) 15 MG tablet Take 1 tablet (15 mg total) by mouth daily as needed for pain. 11/01/18   Tommie Samsook, Keydi Giel G, DO    Family History Family History  Problem Relation Age of Onset  . Heart failure Father   . Cancer Other   . Diabetes Other     Social History Social History   Tobacco Use  . Smoking status: Never Smoker  . Smokeless tobacco: Never Used  Substance Use Topics  . Alcohol use: Yes    Alcohol/week: 6.0 standard drinks    Types: 6 Cans of beer per week    Comment: occasional  . Drug use: No     Allergies   Patient has no known allergies.   Review of Systems Review of Systems  Constitutional: Negative.   Musculoskeletal:       Right shoulder pain.   Physical Exam Triage Vital Signs ED Triage Vitals  Enc Vitals Group     BP 11/01/18 1556 130/85     Pulse Rate 11/01/18 1556 77     Resp  11/01/18 1556 18     Temp 11/01/18 1556 99 F (37.2 C)     Temp src --      SpO2 11/01/18 1556 98 %     Weight 11/01/18 1600 161 lb (73 kg)     Height 11/01/18 1600 5\' 9"  (1.753 m)     Head Circumference --      Peak Flow --      Pain Score 11/01/18 1559 3     Pain Loc --      Pain Edu? --      Excl. in GC? --    Updated Vital Signs BP 130/85 (BP Location: Left Arm)   Pulse 77   Temp 99 F (37.2 C)   Resp 18   Ht 5\' 9"  (1.753 m)   Wt 73 kg   SpO2 98%   BMI 23.78 kg/m   Visual Acuity Right Eye Distance:   Left Eye Distance:   Bilateral Distance:    Right Eye Near:   Left Eye Near:    Bilateral Near:     Physical Exam Vitals signs and nursing note reviewed.  Constitutional:      General: He is not in acute distress.    Appearance: Normal appearance.  HENT:  Head: Normocephalic and atraumatic.  Eyes:     General:        Right eye: No discharge.        Left eye: No discharge.     Conjunctiva/sclera: Conjunctivae normal.  Pulmonary:     Effort: Pulmonary effort is normal. No respiratory distress.  Musculoskeletal:     Comments: Shoulder: Right Inspection reveals no abnormalities, atrophy or asymmetry. Palpation is normal with no tenderness over AC joint or bicipital groove. Rotator cuff strength normal throughout. + [place].   Skin:    General: Skin is warm.     Findings: No rash.  Neurological:     Mental Status: He is alert.  Psychiatric:        Mood and Affect: Mood normal.        Behavior: Behavior normal.    UC Treatments / Results  Labs (all labs ordered are listed, but only abnormal results are displayed) Labs Reviewed - No data to display  EKG None  Radiology No results found.  Procedures Procedures (including critical care time)  Medications Ordered in UC Medications - No data to display  Initial Impression / Assessment and Plan / UC Course  I have reviewed the triage vital signs and the nursing notes.  Pertinent labs &  imaging results that were available during my care of the patient were reviewed by me and considered in my medical decision making (see chart for details).    38 year old male presents with right shoulder pain.  Suspect rotator cuff tendinopathy.  Meloxicam as directed.  Shoulder range of motion exercises given.  Advised to see orthopedics if fails to improve or worsens.  Final Clinical Impressions(s) / UC Diagnoses   Final diagnoses:  Chronic right shoulder pain  Disorder of right rotator cuff     Discharge Instructions     Exercises daily.  Medication as directed.  If persists, see [facility] or [facility].  Take care  Dr. Adriana Simas    ED Prescriptions    Medication Sig Dispense Auth. Provider   meloxicam (MOBIC) 15 MG tablet Take 1 tablet (15 mg total) by mouth daily as needed for pain. 30 tablet Tommie Sams, DO     Controlled Substance Prescriptions [place] Controlled Substance Registry consulted? Not Applicable   Tommie Sams, DO 11/01/18 1626

## 2018-11-21 ENCOUNTER — Other Ambulatory Visit: Payer: Self-pay | Admitting: Family Medicine

## 2019-01-13 ENCOUNTER — Encounter: Payer: Self-pay | Admitting: Emergency Medicine

## 2019-01-13 ENCOUNTER — Emergency Department
Admission: EM | Admit: 2019-01-13 | Discharge: 2019-01-13 | Disposition: A | Discharge status: Home / Self Care | Payer: BC Managed Care – PPO | Attending: Student in an Organized Health Care Education/Training Program | Admitting: Student in an Organized Health Care Education/Training Program

## 2019-01-13 ENCOUNTER — Other Ambulatory Visit: Payer: Self-pay

## 2019-01-13 ENCOUNTER — Emergency Department: Payer: BC Managed Care – PPO

## 2019-01-13 DIAGNOSIS — Z23 Encounter for immunization: Secondary | ICD-10-CM | POA: Diagnosis not present

## 2019-01-13 DIAGNOSIS — Y9389 Activity, other specified: Secondary | ICD-10-CM | POA: Insufficient documentation

## 2019-01-13 DIAGNOSIS — Y92410 Unspecified street and highway as the place of occurrence of the external cause: Secondary | ICD-10-CM | POA: Diagnosis not present

## 2019-01-13 DIAGNOSIS — S161XXA Strain of muscle, fascia and tendon at neck level, initial encounter: Secondary | ICD-10-CM | POA: Diagnosis not present

## 2019-01-13 DIAGNOSIS — Y999 Unspecified external cause status: Secondary | ICD-10-CM | POA: Insufficient documentation

## 2019-01-13 DIAGNOSIS — S199XXA Unspecified injury of neck, initial encounter: Secondary | ICD-10-CM | POA: Diagnosis present

## 2019-01-13 DIAGNOSIS — M25511 Pain in right shoulder: Secondary | ICD-10-CM | POA: Diagnosis not present
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
## 2019-01-13 MED ORDER — TETANUS-DIPHTH-ACELL PERTUSSIS 5-2.5-18.5 LF-MCG/0.5 IM SUSP
0.5000 mL | Freq: Once | INTRAMUSCULAR | Status: AC
Start: 2019-01-13 — End: 2019-01-13
  Administered 2019-01-13: 18:00:00 0.5 mL via INTRAMUSCULAR
  Filled 2019-01-13: qty 0.5

## 2019-01-13 MED ORDER — IBUPROFEN 600 MG PO TABS: 600.0000 mg | ORAL_TABLET | Freq: Three times a day (TID) | ORAL | 0 refills | Status: DC | PRN
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
## 2019-01-13 NOTE — Discharge Instructions (Signed)
Follow-up with your primary care provider, urgent care or return to the emergency department if any continued problems or concerns.  Begin taking ibuprofen 600 mg 3 times daily with food.  You may use ice or heat to your muscles as needed for discomfort.  You will be sore for approximately 4 to 5 days.  Try to move frequently to help with the soreness.  Also watch her abrasions for any signs of infection.  Clean daily with mild soap and water.
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
## 2019-01-15 ENCOUNTER — Other Ambulatory Visit: Payer: Self-pay

## 2019-01-15 ENCOUNTER — Emergency Department
Admission: EM | Admit: 2019-01-15 | Discharge: 2019-01-15 | Disposition: A | Discharge status: Home / Self Care | Payer: BC Managed Care – PPO | Attending: Emergency Medicine | Admitting: Emergency Medicine

## 2019-01-15 ENCOUNTER — Encounter: Payer: Self-pay | Admitting: Emergency Medicine

## 2019-01-15 DIAGNOSIS — M25511 Pain in right shoulder: Secondary | ICD-10-CM | POA: Insufficient documentation

## 2019-01-15 MED ORDER — CYCLOBENZAPRINE HCL 5 MG PO TABS: 5.0000 mg | ORAL_TABLET | Freq: Three times a day (TID) | ORAL | 0 refills | Status: DC | PRN
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
## 2021-02-22 ENCOUNTER — Encounter: Payer: Self-pay | Admitting: *Deleted

## 2021-02-22 ENCOUNTER — Other Ambulatory Visit: Payer: Self-pay

## 2021-02-22 ENCOUNTER — Emergency Department: Payer: BC Managed Care – PPO

## 2021-02-22 ENCOUNTER — Observation Stay
Admission: EM | Admit: 2021-02-22 | Discharge: 2021-02-24 | Disposition: A | Discharge status: Home / Self Care | Payer: BC Managed Care – PPO | Attending: General Surgery | Admitting: General Surgery

## 2021-02-22 DIAGNOSIS — R1031 Right lower quadrant pain: Secondary | ICD-10-CM | POA: Diagnosis present

## 2021-02-22 DIAGNOSIS — K3580 Unspecified acute appendicitis: Secondary | ICD-10-CM | POA: Diagnosis not present

## 2021-02-22 DIAGNOSIS — Z20822 Contact with and (suspected) exposure to covid-19: Secondary | ICD-10-CM | POA: Diagnosis not present

## 2021-02-22 LAB — CBC
HCT: 43.7 % (ref 39.0–52.0)
Hemoglobin: 14.2 g/dL (ref 13.0–17.0)
MCH: 29.2 pg (ref 26.0–34.0)
MCHC: 32.5 g/dL (ref 30.0–36.0)
MCV: 89.9 fL (ref 80.0–100.0)
Platelets: 226 10*3/uL (ref 150–400)
RBC: 4.86 MIL/uL (ref 4.22–5.81)
RDW: 13.2 % (ref 11.5–15.5)
WBC: 13.1 10*3/uL — ABNORMAL HIGH (ref 4.0–10.5)
nRBC: 0 % (ref 0.0–0.2)

## 2021-02-22 LAB — URINALYSIS, COMPLETE (UACMP) WITH MICROSCOPIC
Bacteria, UA: NONE SEEN
Bilirubin Urine: NEGATIVE
Glucose, UA: NEGATIVE mg/dL
Hgb urine dipstick: NEGATIVE
Ketones, ur: NEGATIVE mg/dL
Leukocytes,Ua: NEGATIVE
Nitrite: NEGATIVE
Protein, ur: NEGATIVE mg/dL
Specific Gravity, Urine: 1.025 (ref 1.005–1.030)
Squamous Epithelial / HPF: NONE SEEN (ref 0–5)
pH: 8 (ref 5.0–8.0)

## 2021-02-22 LAB — LIPASE, BLOOD: Lipase: 30 U/L (ref 11–51)

## 2021-02-22 LAB — COMPREHENSIVE METABOLIC PANEL
ALT: 39 U/L (ref 0–44)
AST: 35 U/L (ref 15–41)
Albumin: 4.5 g/dL (ref 3.5–5.0)
Alkaline Phosphatase: 54 U/L (ref 38–126)
Anion gap: 9 (ref 5–15)
BUN: 9 mg/dL (ref 6–20)
CO2: 24 mmol/L (ref 22–32)
Calcium: 9.1 mg/dL (ref 8.9–10.3)
Chloride: 105 mmol/L (ref 98–111)
Creatinine, Ser: 1 mg/dL (ref 0.61–1.24)
GFR, Estimated: >60 mL/min (ref >60)
Glucose, Bld: 103 mg/dL — ABNORMAL HIGH (ref 70–99)
Potassium: 3.9 mmol/L (ref 3.5–5.1)
Sodium: 138 mmol/L (ref 135–145)
Total Bilirubin: 0.9 mg/dL (ref 0.3–1.2)
Total Protein: 7.7 g/dL (ref 6.5–8.1)

## 2021-02-22 LAB — RESP PANEL BY RT-PCR (FLU A&B, COVID) ARPGX2
Influenza A by PCR: NEGATIVE
Influenza B by PCR: NEGATIVE
SARS Coronavirus 2 by RT PCR: NEGATIVE

## 2021-02-22 MED ORDER — PIPERACILLIN-TAZOBACTAM 3.375 G IVPB
3.3750 g | Freq: Three times a day (TID) | INTRAVENOUS | Status: DC
Start: 2021-02-22 — End: 2021-02-23
  Administered 2021-02-22 – 2021-02-23 (×2): 3.375 g via INTRAVENOUS
  Filled 2021-02-22 (×2): qty 50

## 2021-02-22 MED ORDER — DEXTROSE IN LACTATED RINGERS 5 % IV SOLN: INTRAVENOUS | Status: DC

## 2021-02-22 MED ORDER — HYDROMORPHONE HCL 1 MG/ML IJ SOLN: 0.5000 mg | INTRAMUSCULAR | Status: DC | PRN

## 2021-02-22 MED ORDER — KETOROLAC TROMETHAMINE 30 MG/ML IJ SOLN
30.0000 mg | Freq: Four times a day (QID) | INTRAMUSCULAR | Status: DC | PRN
  Administered 2021-02-23: 30 mg via INTRAVENOUS
  Filled 2021-02-22: qty 1

## 2021-02-22 MED ORDER — ACETAMINOPHEN 500 MG PO TABS
1000.0000 mg | ORAL_TABLET | Freq: Four times a day (QID) | ORAL | Status: DC
  Administered 2021-02-22 – 2021-02-23 (×2): 1000 mg via ORAL
  Filled 2021-02-22 (×3): qty 2

## 2021-02-22 MED ORDER — ONDANSETRON HCL 4 MG/2ML IJ SOLN: 4.0000 mg | Freq: Four times a day (QID) | INTRAMUSCULAR | Status: DC | PRN

## 2021-02-22 MED ORDER — SODIUM CHLORIDE 0.9 % IV BOLUS
1000.0000 mL | Freq: Once | INTRAVENOUS | Status: AC
  Administered 2021-02-22: 1000 mL via INTRAVENOUS

## 2021-02-22 MED ORDER — ONDANSETRON 4 MG PO TBDP
4.0000 mg | ORAL_TABLET | Freq: Four times a day (QID) | ORAL | Status: DC | PRN
  Filled 2021-02-22: qty 1

## 2021-02-22 MED ORDER — IOHEXOL 350 MG/ML SOLN
80.0000 mL | Freq: Once | INTRAVENOUS | Status: AC | PRN
  Administered 2021-02-22: 80 mL via INTRAVENOUS

## 2021-02-22 NOTE — ED Provider Notes (Signed)
[facility] Emergency Department Provider Note   ____________________________________________   I have reviewed the triage vital signs and the nursing notes.   HISTORY  Chief Complaint Abdominal Pain   History limited by: Not Limited   HPI Riley Oconnell is a 40 y.o. male who presents to the emergency department today because of concerns for abdominal pain.  Patient states the pain is located in his right lower abdomen.  He first noticed it this morning around 7 AM.  Did state when he would drive his car bumps would make the pain worse.  The patient denies similar pain in the past.  He has not had any associated nausea or vomiting.  He has not noticed any change in his bowel movements.  Denies any fevers or chills.  He is also concerned for possible COVID given exposure at work.    Records reviewed. Per medical record review patient has a history of hand surgery.    There are no problems to display for this patient.   Past Surgical History:  Procedure Laterality Date   HAND SURGERY Right    trigger finger   HAND TENDON SURGERY Right     Prior to Admission medications   Medication Sig Start Date End Date Taking? Authorizing Provider  cyclobenzaprine (FLEXERIL) 5 MG tablet Take 1 tablet (5 mg total) by mouth 3 (three) times daily as needed. 01/15/19   Menshew, Charlesetta Ivory, PA-C  ibuprofen (ADVIL) 600 MG tablet Take 1 tablet (600 mg total) by mouth every 8 (eight) hours as needed for moderate pain. 01/13/19   Tommi Rumps, PA-C    Allergies Patient has no known allergies.  Family History  Problem Relation Age of Onset   Heart failure Father    Cancer Other    Diabetes Other     Social History Social History   Tobacco Use   Smoking status: Never   Smokeless tobacco: Never  Substance Use Topics   Alcohol use: Yes    Alcohol/week: 6.0 standard drinks    Types: 6 Cans of beer per week    Comment: occasional   Drug use: No     Review of Systems Constitutional: No fever/chills Eyes: No visual changes. ENT: No sore throat. Cardiovascular: Denies chest pain. Respiratory: Denies shortness of breath. Gastrointestinal: Positive for right lower quadrant pain. Genitourinary: Negative for dysuria. Musculoskeletal: Negative for back pain. Skin: Negative for rash. Neurological: Negative for headaches, focal weakness or numbness.  ____________________________________________   PHYSICAL EXAM:  VITAL SIGNS: ED Triage Vitals  Enc Vitals Group     BP 02/22/21 1600 118/69     Pulse Rate 02/22/21 1600 71     Resp 02/22/21 1600 16     Temp 02/22/21 1600 98.4 F (36.9 C)     Temp Source 02/22/21 1600 Oral     SpO2 02/22/21 1600 98 %     Weight 02/22/21 1603 160 lb (72.6 kg)     Height 02/22/21 1603 5\' 9"  (1.753 m)     Head Circumference --      Peak Flow --      Pain Score 02/22/21 1602 6   Constitutional: Alert and oriented.  Eyes: Conjunctivae are normal.  ENT      Head: Normocephalic and atraumatic.      Nose: No congestion/rhinnorhea.      Mouth/Throat: Mucous membranes are moist.      Neck: No stridor. Hematological/Lymphatic/Immunilogical: No cervical lymphadenopathy. Cardiovascular: Normal rate, regular rhythm.  No  murmurs, rubs, or gallops.  Respiratory: Normal respiratory effort without tachypnea nor retractions. Breath sounds are clear and equal bilaterally. No wheezes/rales/rhonchi. Gastrointestinal: Soft. Tender to palpation in the RLQ. Positive Rovsing's sign. Genitourinary: Deferred Musculoskeletal: Normal range of motion in all extremities. No lower extremity edema. Neurologic:  Normal speech and language. No gross focal neurologic deficits are appreciated.  Skin:  Skin is warm, dry and intact. No rash noted. Psychiatric: Mood and affect are normal. Speech and behavior are normal. Patient exhibits appropriate insight and judgment.  ____________________________________________    LABS  (pertinent positives/negatives)  CMP wnl except glu 103 CBC wbc 13.1, hgb 14.2, plt 226 UA not consistent with infection  Lipase 30 COVID negative ____________________________________________   EKG  None  ____________________________________________    RADIOLOGY  CT abd/pel Findings consistent with early appendicitis.   ____________________________________________   PROCEDURES  Procedures  ____________________________________________   INITIAL IMPRESSION / ASSESSMENT AND PLAN / ED COURSE  Pertinent labs & imaging results that were available during my care of the patient were reviewed by me and considered in my medical decision making (see chart for details).   Patient presented to the emergency department today because of concerns for right lower quadrant abdominal pain.  Clinical exam was concerning for possible appendicitis.  Blood work showed a mild leukocytosis.  CT abdomen pelvis was consistent with early acute appendicitis.  Discussed findings with patient.  Will plan on admission to the surgical service.  ___________________________________________   FINAL CLINICAL IMPRESSION(S) / ED DIAGNOSES  Final diagnoses:  Acute appendicitis, unspecified acute appendicitis type     Note: This dictation was prepared with Dragon dictation. Any transcriptional errors that result from this process are unintentional     Phineas Semen, MD 02/22/21 2024

## 2021-02-22 NOTE — ED Triage Notes (Signed)
Patient report mid-abdominal pain that began this morning and has worsened during the day. Patient also states he has been exposed to Covid.

## 2021-02-22 NOTE — ED Notes (Signed)
RN to bedside to start IV. Pt ambulating around the room. He went to toilet and walked back to bed.

## 2021-02-22 NOTE — ED Notes (Signed)
Patient reports last PO intake 1400 today. Patient instructed not to eat or drink anything else for further notice. Patient verbalized understanding of information discussed.

## 2021-02-22 NOTE — ED Notes (Signed)
Pt is CAOx4 and in no signs of distress while IV placement. Pt is laughing and cutting up with RN about needles.

## 2021-02-22 NOTE — ED Notes (Signed)
Patient's visitor arrives.

## 2021-02-23 ENCOUNTER — Observation Stay: Payer: BC Managed Care – PPO | Admitting: Anesthesiology

## 2021-02-23 ENCOUNTER — Encounter
Admission: EM | Disposition: A | Discharge status: Home / Self Care | Payer: Self-pay | Source: Home / Self Care | Attending: Emergency Medicine

## 2021-02-23 ENCOUNTER — Encounter: Payer: Self-pay | Admitting: General Surgery

## 2021-02-23 DIAGNOSIS — K3580 Unspecified acute appendicitis: Secondary | ICD-10-CM

## 2021-02-23 HISTORY — PX: LAPAROSCOPIC APPENDECTOMY: SHX408

## 2021-02-23 LAB — BASIC METABOLIC PANEL
Anion gap: 9 (ref 5–15)
BUN: 7 mg/dL (ref 6–20)
CO2: 25 mmol/L (ref 22–32)
Calcium: 8.6 mg/dL — ABNORMAL LOW (ref 8.9–10.3)
Chloride: 106 mmol/L (ref 98–111)
Creatinine, Ser: 0.89 mg/dL (ref 0.61–1.24)
GFR, Estimated: >60 mL/min (ref >60)
Glucose, Bld: 108 mg/dL — ABNORMAL HIGH (ref 70–99)
Potassium: 3.4 mmol/L — ABNORMAL LOW (ref 3.5–5.1)
Sodium: 140 mmol/L (ref 135–145)

## 2021-02-23 LAB — PHOSPHORUS: Phosphorus: 3.1 mg/dL (ref 2.5–4.6)

## 2021-02-23 LAB — CBC
HCT: 40.6 % (ref 39.0–52.0)
Hemoglobin: 13.3 g/dL (ref 13.0–17.0)
MCH: 30.4 pg (ref 26.0–34.0)
MCHC: 32.8 g/dL (ref 30.0–36.0)
MCV: 92.9 fL (ref 80.0–100.0)
Platelets: 189 10*3/uL (ref 150–400)
RBC: 4.37 MIL/uL (ref 4.22–5.81)
RDW: 13.2 % (ref 11.5–15.5)
WBC: 8.5 10*3/uL (ref 4.0–10.5)
nRBC: 0 % (ref 0.0–0.2)

## 2021-02-23 LAB — MAGNESIUM: Magnesium: 2.3 mg/dL (ref 1.7–2.4)

## 2021-02-23 LAB — HIV ANTIBODY (ROUTINE TESTING W REFLEX): HIV Screen 4th Generation wRfx: NONREACTIVE

## 2021-02-23 SURGERY — APPENDECTOMY, LAPAROSCOPIC
Anesthesia: General | Site: Abdomen

## 2021-02-23 MED ORDER — ACETAMINOPHEN 10 MG/ML IV SOLN
INTRAVENOUS | Status: AC
  Filled 2021-02-23: qty 100

## 2021-02-23 MED ORDER — ROCURONIUM BROMIDE 10 MG/ML (PF) SYRINGE
PREFILLED_SYRINGE | INTRAVENOUS | Status: AC
  Filled 2021-02-23: qty 10

## 2021-02-23 MED ORDER — DEXAMETHASONE SODIUM PHOSPHATE 10 MG/ML IJ SOLN
INTRAMUSCULAR | Status: DC | PRN
Start: 2021-02-23 — End: 2021-02-23
  Administered 2021-02-23: 10 mg via INTRAVENOUS

## 2021-02-23 MED ORDER — ACETAMINOPHEN 10 MG/ML IV SOLN: 1000.0000 mg | Freq: Once | INTRAVENOUS | Status: DC | PRN

## 2021-02-23 MED ORDER — LIDOCAINE-EPINEPHRINE 1 %-1:100000 IJ SOLN
INTRAMUSCULAR | Status: DC | PRN
  Administered 2021-02-23: 5 mL
  Administered 2021-02-23: 10 mL

## 2021-02-23 MED ORDER — FENTANYL CITRATE (PF) 100 MCG/2ML IJ SOLN
INTRAMUSCULAR | Status: AC
  Filled 2021-02-23: qty 2

## 2021-02-23 MED ORDER — DROPERIDOL 2.5 MG/ML IJ SOLN
0.6250 mg | Freq: Once | INTRAMUSCULAR | Status: DC | PRN
  Filled 2021-02-23: qty 2

## 2021-02-23 MED ORDER — PROPOFOL 10 MG/ML IV BOLUS
INTRAVENOUS | Status: AC
  Filled 2021-02-23: qty 20

## 2021-02-23 MED ORDER — 0.9 % SODIUM CHLORIDE (POUR BTL) OPTIME
TOPICAL | Status: DC | PRN
  Administered 2021-02-23: 500 mL

## 2021-02-23 MED ORDER — ONDANSETRON HCL 4 MG/2ML IJ SOLN
INTRAMUSCULAR | Status: DC | PRN
  Administered 2021-02-23: 4 mg via INTRAVENOUS

## 2021-02-23 MED ORDER — KETAMINE HCL 10 MG/ML IJ SOLN
INTRAMUSCULAR | Status: DC | PRN
  Administered 2021-02-23: 25 mg via INTRAVENOUS

## 2021-02-23 MED ORDER — OXYCODONE HCL 5 MG PO TABS
5.0000 mg | ORAL_TABLET | ORAL | Status: DC | PRN
  Administered 2021-02-23 – 2021-02-24 (×3): 5 mg via ORAL
  Filled 2021-02-23 (×3): qty 1

## 2021-02-23 MED ORDER — OXYCODONE HCL 5 MG PO TABS
5.0000 mg | ORAL_TABLET | Freq: Once | ORAL | Status: DC | PRN
Start: 2021-02-23 — End: 2021-02-23

## 2021-02-23 MED ORDER — SODIUM CHLORIDE 0.9 % IV SOLN: INTRAVENOUS | Status: DC | PRN

## 2021-02-23 MED ORDER — DEXAMETHASONE SODIUM PHOSPHATE 10 MG/ML IJ SOLN
INTRAMUSCULAR | Status: AC
  Filled 2021-02-23: qty 1

## 2021-02-23 MED ORDER — LIDOCAINE HCL (PF) 2 % IJ SOLN
INTRAMUSCULAR | Status: AC
  Filled 2021-02-23: qty 5

## 2021-02-23 MED ORDER — MIDAZOLAM HCL 2 MG/2ML IJ SOLN
INTRAMUSCULAR | Status: DC | PRN
Start: 2021-02-23 — End: 2021-02-23
  Administered 2021-02-23: 2 mg via INTRAVENOUS

## 2021-02-23 MED ORDER — MIDAZOLAM HCL 2 MG/2ML IJ SOLN
INTRAMUSCULAR | Status: AC
  Filled 2021-02-23: qty 2

## 2021-02-23 MED ORDER — FENTANYL CITRATE (PF) 100 MCG/2ML IJ SOLN: 25.0000 ug | INTRAMUSCULAR | Status: DC | PRN

## 2021-02-23 MED ORDER — PHENAZOPYRIDINE HCL 200 MG PO TABS
200.0000 mg | ORAL_TABLET | Freq: Three times a day (TID) | ORAL | Status: DC
  Administered 2021-02-23 – 2021-02-24 (×3): 200 mg via ORAL
  Filled 2021-02-23 (×4): qty 1

## 2021-02-23 MED ORDER — ROCURONIUM BROMIDE 100 MG/10ML IV SOLN
INTRAVENOUS | Status: DC | PRN
  Administered 2021-02-23: 50 mg via INTRAVENOUS

## 2021-02-23 MED ORDER — ACETAMINOPHEN 10 MG/ML IV SOLN
INTRAVENOUS | Status: DC | PRN
  Administered 2021-02-23: 1000 mg via INTRAVENOUS

## 2021-02-23 MED ORDER — FENTANYL CITRATE (PF) 100 MCG/2ML IJ SOLN
INTRAMUSCULAR | Status: DC | PRN
  Administered 2021-02-23: 50 ug via INTRAVENOUS

## 2021-02-23 MED ORDER — SUGAMMADEX SODIUM 200 MG/2ML IV SOLN
INTRAVENOUS | Status: DC | PRN
  Administered 2021-02-23: 200 mg via INTRAVENOUS

## 2021-02-23 MED ORDER — PROPOFOL 10 MG/ML IV BOLUS
INTRAVENOUS | Status: DC | PRN
  Administered 2021-02-23: 150 mg via INTRAVENOUS

## 2021-02-23 MED ORDER — EPHEDRINE 5 MG/ML INJ
INTRAVENOUS | Status: AC
  Filled 2021-02-23: qty 5

## 2021-02-23 MED ORDER — EPHEDRINE SULFATE 50 MG/ML IJ SOLN
INTRAMUSCULAR | Status: DC | PRN
  Administered 2021-02-23 (×2): 5 mg via INTRAVENOUS

## 2021-02-23 MED ORDER — LIDOCAINE HCL (CARDIAC) PF 100 MG/5ML IV SOSY
PREFILLED_SYRINGE | INTRAVENOUS | Status: DC | PRN
Start: 2021-02-23 — End: 2021-02-23
  Administered 2021-02-23: 80 mg via INTRAVENOUS

## 2021-02-23 MED ORDER — OXYCODONE HCL 5 MG/5ML PO SOLN: 5.0000 mg | Freq: Once | ORAL | Status: DC | PRN

## 2021-02-23 MED ORDER — ONDANSETRON HCL 4 MG/2ML IJ SOLN
INTRAMUSCULAR | Status: AC
  Filled 2021-02-23: qty 2

## 2021-02-23 MED ORDER — PIPERACILLIN-TAZOBACTAM 3.375 G IVPB
INTRAVENOUS | Status: AC
  Administered 2021-02-23: 3.375 g via INTRAVENOUS
  Filled 2021-02-23: qty 50

## 2021-02-23 MED ORDER — KETOROLAC TROMETHAMINE 30 MG/ML IJ SOLN
INTRAMUSCULAR | Status: DC | PRN
  Administered 2021-02-23: 30 mg via INTRAVENOUS

## 2021-02-23 MED ORDER — PROMETHAZINE HCL 25 MG/ML IJ SOLN
6.2500 mg | INTRAMUSCULAR | Status: DC | PRN
Start: 2021-02-23 — End: 2021-02-23

## 2021-02-23 MED ORDER — KETOROLAC TROMETHAMINE 30 MG/ML IJ SOLN
INTRAMUSCULAR | Status: AC
  Filled 2021-02-23: qty 1

## 2021-02-23 SURGICAL SUPPLY — 51 items
APPLICATOR COTTON TIP 6 STRL (MISCELLANEOUS) ×1 IMPLANT
APPLICATOR COTTON TIP 6IN STRL (MISCELLANEOUS) ×2 IMPLANT
APPLIER CLIP 5 13 M/L LIGAMAX5 (MISCELLANEOUS)
BLADE CLIPPER SURG (BLADE) IMPLANT
BLADE SURG SZ11 CARB STEEL (BLADE) ×2 IMPLANT
CHLORAPREP W/TINT 26 (MISCELLANEOUS) ×2 IMPLANT
CLIP APPLIE 5 13 M/L LIGAMAX5 (MISCELLANEOUS) IMPLANT
CUTTER FLEX LINEAR 45M (STAPLE) ×2 IMPLANT
DEFOGGER SCOPE WARMER CLEARIFY (MISCELLANEOUS) ×2 IMPLANT
DERMABOND ADVANCED (GAUZE/BANDAGES/DRESSINGS) ×1
DERMABOND ADVANCED .7 DNX12 (GAUZE/BANDAGES/DRESSINGS) ×1 IMPLANT
ELECT CAUTERY BLADE TIP 2.5 (TIP) ×2
ELECT REM PT RETURN 9FT ADLT (ELECTROSURGICAL) ×2
ELECTRODE CAUTERY BLDE TIP 2.5 (TIP) ×1 IMPLANT
ELECTRODE REM PT RTRN 9FT ADLT (ELECTROSURGICAL) ×1 IMPLANT
GAUZE 4X4 16PLY ~~LOC~~+RFID DBL (SPONGE) ×2 IMPLANT
GLOVE SURG SYN 6.5 ES PF (GLOVE) ×2 IMPLANT
GLOVE SURG UNDER LTX SZ7 (GLOVE) ×2 IMPLANT
GOWN STRL REUS W/ TWL LRG LVL3 (GOWN DISPOSABLE) ×2 IMPLANT
GOWN STRL REUS W/TWL LRG LVL3 (GOWN DISPOSABLE) ×2
GRASPER SUT TROCAR 14GX15 (MISCELLANEOUS) ×2 IMPLANT
IRRIGATION STRYKERFLOW (MISCELLANEOUS) ×1 IMPLANT
IRRIGATOR STRYKERFLOW (MISCELLANEOUS) ×2
IV NS 1000ML (IV SOLUTION) ×1
IV NS 1000ML BAXH (IV SOLUTION) ×1 IMPLANT
KIT TURNOVER KIT A (KITS) ×2 IMPLANT
KITTNER LAPARASCOPIC 5X40 (MISCELLANEOUS) ×2 IMPLANT
LABEL OR SOLS (LABEL) ×2 IMPLANT
MANIFOLD NEPTUNE II (INSTRUMENTS) ×2 IMPLANT
NEEDLE HYPO 22GX1.5 SAFETY (NEEDLE) ×2 IMPLANT
NS IRRIG 500ML POUR BTL (IV SOLUTION) ×2 IMPLANT
PACK LAP CHOLECYSTECTOMY (MISCELLANEOUS) ×2 IMPLANT
PENCIL ELECTRO HAND CTR (MISCELLANEOUS) ×2 IMPLANT
POUCH SPECIMEN RETRIEVAL 10MM (ENDOMECHANICALS) ×2 IMPLANT
RELOAD STAPLE TA45 3.5 REG BLU (ENDOMECHANICALS) ×2 IMPLANT
SCISSORS METZENBAUM CVD 33 (INSTRUMENTS) ×2 IMPLANT
SET TUBE SMOKE EVAC HIGH FLOW (TUBING) ×2 IMPLANT
SHEARS HARMONIC ACE PLUS 36CM (ENDOMECHANICALS) ×2 IMPLANT
SLEEVE ADV FIXATION 5X100MM (TROCAR) ×2 IMPLANT
STRIP CLOSURE SKIN 1/2X4 (GAUZE/BANDAGES/DRESSINGS) ×2 IMPLANT
SUT MNCRL 4-0 (SUTURE) ×1
SUT MNCRL 4-0 27XMFL (SUTURE) ×1
SUT VIC AB 3-0 SH 27 (SUTURE) ×1
SUT VIC AB 3-0 SH 27X BRD (SUTURE) ×1 IMPLANT
SUT VICRYL 0 AB UR-6 (SUTURE) ×2 IMPLANT
SUTURE MNCRL 4-0 27XMF (SUTURE) ×1 IMPLANT
SYS KII FIOS ACCESS ABD 5X100 (TROCAR) ×2
SYSTEM KII FIOS ACES ABD 5X100 (TROCAR) ×1 IMPLANT
TRAY FOLEY MTR SLVR 16FR STAT (SET/KITS/TRAYS/PACK) ×2 IMPLANT
TROCAR ADV FIXATION 12X100MM (TROCAR) IMPLANT
TROCAR BALLN GELPORT 12X130M (ENDOMECHANICALS) ×2 IMPLANT

## 2021-02-23 NOTE — Transfer of Care (Signed)
Immediate Anesthesia Transfer of Care Note  Patient: Riley Oconnell  Procedure(s) Performed: APPENDECTOMY LAPAROSCOPIC (Abdomen)  Patient Location: PACU  Anesthesia Type:General  Level of Consciousness: awake and alert   Airway & Oxygen Therapy: Patient Spontanous Breathing and Patient connected to face mask oxygen  Post-op Assessment: Report given to RN and Post -op Vital signs reviewed and stable  Post vital signs: Reviewed and stable  Last Vitals:  Vitals Value Taken Time  BP 125/77 02/23/21 1406  Temp 36.2 C 02/23/21 1406  Pulse 66 02/23/21 1410  Resp 19 02/23/21 1410  SpO2 99 % 02/23/21 1410  Vitals shown include unvalidated device data.  Last Pain:  Vitals:   02/23/21 1406  TempSrc:   PainSc: 0-No pain         Complications: No notable events documented.

## 2021-02-23 NOTE — Anesthesia Procedure Notes (Signed)
Procedure Name: Intubation Date/Time: 02/23/2021 12:35 PM Performed by: Ginger Carne, CRNA Pre-anesthesia Checklist: Patient identified, Patient being monitored, Timeout performed, Emergency Drugs available and Suction available Patient Re-evaluated:Patient Re-evaluated prior to induction Oxygen Delivery Method: Circle system utilized Preoxygenation: Pre-oxygenation with 100% oxygen Induction Type: IV induction Ventilation: Mask ventilation without difficulty Laryngoscope Size: 3 and McGraph Grade View: Grade I Tube type: Oral Tube size: 7.0 mm Number of attempts: 1 Airway Equipment and Method: Stylet Placement Confirmation: ETT inserted through vocal cords under direct vision, positive ETCO2 and breath sounds checked- equal and bilateral Secured at: 22 cm Tube secured with: Tape Dental Injury: Teeth and Oropharynx as per pre-operative assessment

## 2021-02-23 NOTE — Op Note (Signed)
Operative Note  Laparoscopic Appendectomy   EDGERRIN CORREIA Date of operation:  02/23/2021  Indications: The patient presented with a history of  abdominal pain. Workup has revealed findings consistent with acute appendicitis.  Pre-operative Diagnosis: Acute appendicitis without mention of peritonitis  Post-operative Diagnosis: Same  Surgeon: Duanne Guess, MD  Anesthesia: GETA  Findings: inflamed appendix without perforation, abscess, or peritonitis  Estimated Blood Loss: <1cc         Specimens: appendix         Complications:  none immediately apparent.  Procedure Details  The patient was seen again in the preop area. The options of surgery versus observation were reviewed with the patient and/or family. The risks of bleeding, infection, recurrence of symptoms, negative laparoscopy, potential for an open procedure, bowel injury, abscess or infection, were all reviewed as well. The patient was taken to Operating Room, identified as Riley Oconnell and the procedure verified as laparoscopic appendectomy. A time out was performed and the above information confirmed.  The patient was placed in the supine position and general anesthesia was induced.  Antibiotic prophylaxis was administered and VTE prophylaxis was in place. A Foley catheter was placed by the nursing staff.   The abdomen was prepped and draped in a sterile fashion. A supraumbilical incision was made. A cutdown technique was used to enter the abdominal cavity. Two vicryl stitches were placed on the fascia and a Hasson trocar inserted. Pneumoperitoneum obtained. Two 5 mm ports were placed under direct visualization.  The appendix was identified and found to be acutely inflamed, but without perforation, abscess, or localized peritonitis. The appendix was carefully dissected away from the cecum and other surrounding structures. The mesoappendix was divided with the Harmonic scalpel. The base of the appendix was  dissected out and divided with a standard load Endo GIA.The appendix was placed in a Endo Catch bag and removed via the Hasson port. The right lower quadrant was inspected there was no sign of bleeding or bowel injury therefore pneumoperitoneum was released, all ports were removed.  The umbilical fascia was closed with 0 Vicryl interrupted sutures and the skin incisions were approximated with subcuticular 4-0 Monocryl. Dermabond was applied The patient tolerated the procedure well and there were no immediately apparent complications. The sponge lap and needle count were correct at the end of the procedure.  The patient was taken to the recovery room in stable condition to be admitted for continued care.   Duanne Guess, MD, FACS

## 2021-02-23 NOTE — Plan of Care (Signed)
  Problem: Education: Goal: Knowledge of General Education information will improve Description Including pain rating scale, medication(s)/side effects and non-pharmacologic comfort measures Outcome: Progressing   Problem: Health Behavior/Discharge Planning: Goal: Ability to manage health-related needs will improve Outcome: Progressing   

## 2021-02-23 NOTE — Anesthesia Postprocedure Evaluation (Signed)
Anesthesia Post Note  Patient: Riley Oconnell  Procedure(s) Performed: APPENDECTOMY LAPAROSCOPIC (Abdomen)  Patient location during evaluation: PACU Anesthesia Type: General Level of consciousness: awake and alert Pain management: pain level controlled Vital Signs Assessment: post-procedure vital signs reviewed and stable Respiratory status: spontaneous breathing, nonlabored ventilation and respiratory function stable Cardiovascular status: blood pressure returned to baseline and stable Postop Assessment: no apparent nausea or vomiting Anesthetic complications: no   No notable events documented.   Last Vitals:  Vitals:   02/23/21 1359 02/23/21 1406  BP: 123/75 125/77  Pulse:    Resp:    Temp:  (!) 36.2 C  SpO2:      Last Pain:  Vitals:   02/23/21 1406  TempSrc:   PainSc: 0-No pain                 Foye Deer

## 2021-02-23 NOTE — Anesthesia Preprocedure Evaluation (Addendum)
Anesthesia Evaluation  Patient identified by MRN, date of birth, ID band Patient awake    Reviewed: Allergy & Precautions, NPO status , Patient's Chart, lab work & pertinent test results  Airway Mallampati: II  TM Distance: >3 FB Neck ROM: Full    Dental no notable dental hx.    Pulmonary neg pulmonary ROS,    Pulmonary exam normal breath sounds clear to auscultation       Cardiovascular negative cardio ROS Normal cardiovascular exam Rhythm:Regular Rate:Normal     Neuro/Psych negative neurological ROS  negative psych ROS   GI/Hepatic negative GI ROS, Neg liver ROS,   Endo/Other  negative endocrine ROS  Renal/GU negative Renal ROS  negative genitourinary   Musculoskeletal negative musculoskeletal ROS (+)   Abdominal Normal abdominal exam  (+)   Peds negative pediatric ROS (+)  Hematology negative hematology ROS (+)   Anesthesia Other Findings Alcohol Use 6 drinks per week  Appendicitis  Reproductive/Obstetrics negative OB ROS                             Anesthesia Physical Anesthesia Plan  ASA: 2  Anesthesia Plan: General ETT   Post-op Pain Management:    Induction: Intravenous  PONV Risk Score and Plan: 2 and Dexamethasone, Ondansetron and Midazolam  Airway Management Planned: Oral ETT  Additional Equipment:   Intra-op Plan:   Post-operative Plan: Extubation in OR  Informed Consent: I have reviewed the patients History and Physical, chart, labs and discussed the procedure including the risks, benefits and alternatives for the proposed anesthesia with the patient or authorized representative who has indicated his/her understanding and acceptance.     Dental Advisory Given  Plan Discussed with: Anesthesiologist, CRNA and Surgeon  Anesthesia Plan Comments: (Patient consented for risks of anesthesia including but not limited to:  - adverse reactions to medications -  damage to eyes, teeth, lips or other oral mucosa - nerve damage due to positioning  - sore throat or hoarseness - Damage to heart, brain, nerves, lungs, other parts of body or loss of life  Patient voiced understanding.)        Anesthesia Quick Evaluation

## 2021-02-23 NOTE — H&P (Signed)
Reason for Consult:acute appendicitis  Referring Physician: Derrill Kay (ED)  Riley Oconnell is an 40 y.o. male.  HPI: He presented to the emergency department yesterday evening because of concerns for abdominal pain.  Patient states the pain is located in his right lower abdomen.  He first noticed it this morning around 7 AM.  Did state when he would drive his car bumps would make the pain worse.  The patient denies similar pain in the past.  He has not had any associated nausea or vomiting.  He has not noticed any change in his bowel movements.  Denies any fevers or chills.  He is also concerned for possible COVID given exposure at work.  ED workup included labs showing leukocytosis of 13.1K and a CT scan consistent with early acute appendicitis.  COVID testing negative.  History reviewed. No pertinent past medical history.  Past Surgical History:  Procedure Laterality Date   HAND SURGERY Right    trigger finger   HAND TENDON SURGERY Right     Family History  Problem Relation Age of Onset   Heart failure Father    Cancer Other    Diabetes Other     Social History:  reports that he has never smoked. He has never used smokeless tobacco. He reports current alcohol use of about 6.0 standard drinks per week. He reports that he does not use drugs.  Allergies: No Known Allergies  Medications: I have reviewed the patient's current medications.  Results for orders placed or performed during the hospital encounter of 02/22/21 (from the past 48 hour(s))  Lipase, blood     Status: None   Collection Time: 02/22/21  4:14 PM  Result Value Ref Range   Lipase 30 11 - 51 U/L    Comment: Performed at [facility], [address] [place], [place] [address]  Comprehensive metabolic panel     Status: Abnormal   Collection Time: 02/22/21  4:14 PM  Result Value Ref Range   Sodium 138 135 - 145 mmol/L   Potassium 3.9 3.5 - 5.1 mmol/L   Chloride 105 98 - 111 mmol/L   CO2 24 22 - 32  mmol/L   Glucose, Bld 103 (H) 70 - 99 mg/dL    Comment: Glucose reference range applies only to samples taken after fasting for at least 8 hours.   BUN 9 6 - 20 mg/dL   Creatinine, Ser 6.04 0.61 - 1.24 mg/dL   Calcium 9.1 8.9 - 54.0 mg/dL   Total Protein 7.7 6.5 - 8.1 g/dL   Albumin 4.5 3.5 - 5.0 g/dL   AST 35 15 - 41 U/L   ALT 39 0 - 44 U/L   Alkaline Phosphatase 54 38 - 126 U/L   Total Bilirubin 0.9 0.3 - 1.2 mg/dL   GFR, Estimated >98 >11 mL/min    Comment: (NOTE) Calculated using the CKD-EPI Creatinine Equation (2021)    Anion gap 9 5 - 15    Comment: Performed at [facility], [address] [place], [place] [address]  CBC     Status: Abnormal   Collection Time: 02/22/21  4:14 PM  Result Value Ref Range   WBC 13.1 (H) 4.0 - 10.5 K/uL   RBC 4.86 4.22 - 5.81 MIL/uL   Hemoglobin 14.2 13.0 - 17.0 g/dL   HCT 29.5 62.1 - 30.8 %   MCV 89.9 80.0 - 100.0 fL   MCH 29.2 26.0 - 34.0 pg   MCHC 32.5 30.0 - 36.0 g/dL  RDW 13.2 11.5 - 15.5 %   Platelets 226 150 - 400 K/uL   nRBC 0.0 0.0 - 0.2 %    Comment: Performed at [facility], [address] [place], [place] [address]  Urinalysis, Complete w Microscopic     Status: Abnormal   Collection Time: 02/22/21  4:14 PM  Result Value Ref Range   Color, Urine YELLOW YELLOW   APPearance CLEAR (A) CLEAR   Specific Gravity, Urine 1.025 1.005 - 1.030   pH 8.0 5.0 - 8.0   Glucose, UA NEGATIVE NEGATIVE mg/dL   Hgb urine dipstick NEGATIVE NEGATIVE   Bilirubin Urine NEGATIVE NEGATIVE   Ketones, ur NEGATIVE NEGATIVE mg/dL   Protein, ur NEGATIVE NEGATIVE mg/dL   Nitrite NEGATIVE NEGATIVE   Leukocytes,Ua NEGATIVE NEGATIVE   RBC / HPF 0-5 0 - 5 RBC/hpf   WBC, UA 0-5 0 - 5 WBC/hpf   Bacteria, UA NONE SEEN NONE SEEN   Squamous Epithelial / LPF NONE SEEN 0 - 5   Mucus PRESENT     Comment: Performed at [facility], [address], [place], [place] [address]  Resp Panel by RT-PCR (Flu A&B, Covid) Nasopharyngeal  Swab     Status: None   Collection Time: 02/22/21  5:32 PM   Specimen: Nasopharyngeal Swab; Nasopharyngeal(NP) swabs in vial transport medium  Result Value Ref Range   SARS Coronavirus 2 by RT PCR NEGATIVE NEGATIVE    Comment: (NOTE) SARS-CoV-2 target nucleic acids are NOT DETECTED.  The SARS-CoV-2 RNA is generally detectable in upper respiratory specimens during the acute phase of infection. The lowest concentration of SARS-CoV-2 viral copies this assay can detect is 138 copies/mL. A negative result does not preclude SARS-Cov-2 infection and should not be used as the sole basis for treatment or other patient management decisions. A negative result may occur with  improper specimen collection/handling, submission of specimen other than nasopharyngeal swab, presence of viral mutation(s) within the areas targeted by this assay, and inadequate number of viral copies(<138 copies/mL). A negative result must be combined with clinical observations, patient history, and epidemiological information. The expected result is Negative.  Fact Sheet for Patients:  BloggerCourse.com  Fact Sheet for Healthcare Providers:  SeriousBroker.it  This test is no t yet approved or cleared by the [place] FDA and  has been authorized for detection and/or diagnosis of SARS-CoV-2 by FDA under an Emergency Use Authorization (EUA). This EUA will remain  in effect (meaning this test can be used) for the duration of the COVID-19 declaration under Section 564(b)(1) of the Act, 21 U.S.C.section 360bbb-3(b)(1), unless the authorization is terminated  or revoked sooner.       Influenza A by PCR NEGATIVE NEGATIVE   Influenza B by PCR NEGATIVE NEGATIVE    Comment: (NOTE) The Xpert Xpress SARS-CoV-2/FLU/RSV plus assay is intended as an aid in the diagnosis of influenza from Nasopharyngeal swab specimens and should not be used as a sole basis for treatment.  Nasal washings and aspirates are unacceptable for Xpert Xpress SARS-CoV-2/FLU/RSV testing.  Fact Sheet for Patients: BloggerCourse.com  Fact Sheet for Healthcare Providers: SeriousBroker.it  This test is not yet approved or cleared by the [place] FDA and has been authorized for detection and/or diagnosis of SARS-CoV-2 by FDA under an Emergency Use Authorization (EUA). This EUA will remain in effect (meaning this test can be used) for the duration of the COVID-19 declaration under Section 564(b)(1) of the Act, 21 U.S.C. section 360bbb-3(b)(1), unless the authorization is terminated or  revoked.  Performed at [facility], [address] [place], [place] [address]   HIV Antibody (routine testing w rflx)     Status: None   Collection Time: 02/22/21  8:08 PM  Result Value Ref Range   HIV Screen 4th Generation wRfx Non Reactive Non Reactive    Comment: Performed at [facility], [address] [place], [place] [address]  Basic metabolic panel     Status: Abnormal   Collection Time: 02/23/21  7:10 AM  Result Value Ref Range   Sodium 140 135 - 145 mmol/L   Potassium 3.4 (L) 3.5 - 5.1 mmol/L   Chloride 106 98 - 111 mmol/L   CO2 25 22 - 32 mmol/L   Glucose, Bld 108 (H) 70 - 99 mg/dL    Comment: Glucose reference range applies only to samples taken after fasting for at least 8 hours.   BUN 7 6 - 20 mg/dL   Creatinine, Ser 6.37 0.61 - 1.24 mg/dL   Calcium 8.6 (L) 8.9 - 10.3 mg/dL   GFR, Estimated >85 >88 mL/min    Comment: (NOTE) Calculated using the CKD-EPI Creatinine Equation (2021)    Anion gap 9 5 - 15    Comment: Performed at [facility], [address] [place], [place] [address]  Magnesium     Status: None   Collection Time: 02/23/21  7:10 AM  Result Value Ref Range   Magnesium 2.3 1.7 - 2.4 mg/dL    Comment: Performed at [facility], [address] [place], [place] [address]   Phosphorus     Status: None   Collection Time: 02/23/21  7:10 AM  Result Value Ref Range   Phosphorus 3.1 2.5 - 4.6 mg/dL    Comment: Performed at [facility], [address] [place], [place] [address]  CBC     Status: None   Collection Time: 02/23/21  7:10 AM  Result Value Ref Range   WBC 8.5 4.0 - 10.5 K/uL   RBC 4.37 4.22 - 5.81 MIL/uL   Hemoglobin 13.3 13.0 - 17.0 g/dL   HCT 20.9 47.0 - 96.2 %   MCV 92.9 80.0 - 100.0 fL   MCH 30.4 26.0 - 34.0 pg   MCHC 32.8 30.0 - 36.0 g/dL   RDW 83.6 62.9 - 47.6 %   Platelets 189 150 - 400 K/uL   nRBC 0.0 0.0 - 0.2 %    Comment: Performed at [facility], [address] [place], [place] [address]    CT ABDOMEN PELVIS W CONTRAST  Result Date: 02/22/2021 CLINICAL DATA:  Mid abdominal pain beginning this morning. History also reports right lower quadrant pain. Patient has been exposed to COVID-19. EXAM: CT ABDOMEN AND PELVIS WITH CONTRAST TECHNIQUE: Multidetector CT imaging of the abdomen and pelvis was performed using the standard protocol following bolus administration of intravenous contrast. CONTRAST:  34mL OMNIPAQUE IOHEXOL 350 MG/ML SOLN COMPARISON:  None. FINDINGS: Lower chest: Clear lung bases.  Heart normal in size. Hepatobiliary: No focal liver abnormality is seen. No gallstones, gallbladder wall thickening, or biliary dilatation. Pancreas: Unremarkable. No pancreatic ductal dilatation or surrounding inflammatory changes. Spleen: Normal in size without focal abnormality. Adrenals/Urinary Tract: Adrenal glands are unremarkable. Kidneys are normal, without renal calculi, focal lesion, or hydronephrosis. Bladder is unremarkable. Stomach/Bowel: Appendix is dilated to 7.5 mm. Wall mildly thickened. No adjacent inflammatory change. No evidence of an abscess. No extraluminal air. Normal stomach. Small bowel and colon are normal in caliber. No  wall thickening. No inflammation. Vascular/Lymphatic: No significant vascular findings  are present. No enlarged abdominal or pelvic lymph nodes. Reproductive: Unremarkable. Other: No abdominal wall hernia or abnormality. No abdominopelvic ascites. Musculoskeletal: Chronic bilateral pars defects at L5-S1 with a grade 1 anterolisthesis. No acute fractures. No bone lesions. IMPRESSION: 1. Dilated appendix with mild wall thickening. Findings consistent with early acute appendicitis. No evidence of rupture or of an abscess. 2. No other acute abnormality within the abdomen or pelvis. 3. Chronic bilateral pars defects at L5-S1 with a grade 1 anterolisthesis. Electronically Signed   By: Amie Portlandavid  Ormond M.D.   On: 02/22/2021 19:16    Review of Systems  Gastrointestinal:  Positive for abdominal pain.  All other systems reviewed and are negative. Blood pressure (!) 97/59, pulse 72, temperature 98.6 F (37 C), temperature source Axillary, resp. rate 14, height 5\' 9"  (1.753 m), weight 72.6 kg, SpO2 99 %. Physical Exam Constitutional:      General: He is not in acute distress.    Appearance: He is normal weight.  HENT:     Head: Normocephalic and atraumatic.     Comments: Multiple piercings (not-removable) Cardiovascular:     Rate and Rhythm: Normal rate and regular rhythm.  Pulmonary:     Effort: Pulmonary effort is normal. No respiratory distress.  Abdominal:     Tenderness: There is abdominal tenderness.     Comments: RLQ  Genitourinary:    Comments: deferred Musculoskeletal:        General: No swelling or tenderness.  Skin:    General: Skin is warm and dry.     Comments: Multiple tattoos  Neurological:     General: No focal deficit present.     Mental Status: He is alert and oriented to person, place, and time.  Psychiatric:        Mood and Affect: Mood normal.        Behavior: Behavior normal.    Assessment/Plan: 40 y/o M with acute appendicitis. Will plan on laparoscopic appendectomy pending OR and anesthesia availability.  Duanne GuessJennifer Lyell Clugston 02/23/2021, 11:18 AM

## 2021-02-23 NOTE — Progress Notes (Signed)
OR notified about dermal piercings which cannot be removed.

## 2021-02-23 NOTE — Plan of Care (Signed)
  Problem: Education: Goal: Knowledge of General Education information will improve Description Including pain rating scale, medication(s)/side effects and non-pharmacologic comfort measures Outcome: Progressing   

## 2021-02-24 ENCOUNTER — Telehealth: Payer: Self-pay | Admitting: *Deleted

## 2021-02-24 ENCOUNTER — Encounter: Payer: Self-pay | Admitting: General Surgery

## 2021-02-24 MED ORDER — IBUPROFEN 600 MG PO TABS: 600.0000 mg | ORAL_TABLET | Freq: Three times a day (TID) | ORAL | 0 refills | Status: DC | PRN

## 2021-02-24 MED ORDER — OXYCODONE HCL 5 MG PO TABS: 5.0000 mg | ORAL_TABLET | ORAL | 0 refills | Status: DC | PRN

## 2021-02-24 NOTE — Discharge Instructions (Signed)
In addition to included general post-operative instructions,  Diet: Resume home diet.   Activity: No heavy lifting >20 pounds (children, pets, laundry, garbage) or strenuous activity until follow-up in 2 weeks, but light activity and walking are encouraged. Do not drive or drink alcohol if taking narcotic pain medications or having pain that might distract from driving.  Wound care: 2 days after surgery (08/23), you may shower/get incision wet with soapy water and pat dry (do not rub incisions), but no baths or submerging incision underwater until follow-up.   Medications: Resume all home medications. For mild to moderate pain: acetaminophen (Tylenol) or ibuprofen/naproxen (if no kidney disease). Combining Tylenol with alcohol can substantially increase your risk of causing liver disease. Narcotic pain medications, if prescribed, can be used for severe pain, though may cause nausea, constipation, and drowsiness. Do not combine Tylenol and Percocet (or similar) within a 6 hour period as Percocet (and similar) contain(s) Tylenol. If you do not need the narcotic pain medication, you do not need to fill the prescription.  Call office ([phone] / [phone]) at any time if any questions, worsening pain, fevers/chills, bleeding, drainage from incision site, or other concerns.  

## 2021-02-24 NOTE — Progress Notes (Signed)
Discharge Note: °Reviewed discharge instructions with pt.  °Pt verbalized understanding. IV cath intact upon removal. Pt discharged with all personal belongings. Staff wheeled pt out. Pt transported to home via family vehicle. °

## 2021-02-24 NOTE — Telephone Encounter (Signed)
Letter printed and placed upfront. Patient will call back with a number that it may be faxed to.

## 2021-02-24 NOTE — Telephone Encounter (Signed)
Patient called and he had surgery yesterday by Dr.Cannon Appendectomy. He would like to come pick up a work note stating that he can return to work on 03/24/21 with no restrictions. 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 Please call and advise

## 2021-02-24 NOTE — Discharge Summary (Signed)
[facility] SURGICAL DISCHARGE SUMMARY  Patient ID: Riley Oconnell MRN: [id number] DOB/AGE: 1980-11-15 40 y.o.  Admit date: 02/22/2021 Discharge date: 02/24/2021  Discharge Diagnoses Patient Active Problem List   Diagnosis Date Noted   Acute appendicitis 02/22/2021    Consultants None  Procedures 02/23/2021:  Laparoscopic Appendectomy  HPI: Riley Oconnell is an 40 y.o. male who presented to the emergency department yesterday evening because of concerns for abdominal pain.  Patient states the pain is located in his right lower abdomen.  He first noticed it this morning around 7 AM.  Did state when he would drive his car bumps would make the pain worse.  The patient denies similar pain in the past.  He has not had any associated nausea or vomiting.  He has not noticed any change in his bowel movements.  Denies any fevers or chills.  He is also concerned for possible COVID given exposure at work. ED workup included labs showing leukocytosis of 13.1K and a CT scan consistent with early acute appendicitis.  COVID testing negative.  Hospital Course: Informed consent was obtained and documented, and patient underwent uneventful laparoscopic appendectomy (Dr Lady Gary, 02/23/2021).  Post-operatively, patient's pain/symptoms improved/resolved and advancement of patient's diet and ambulation were well-tolerated. The remainder of patient's hospital course was essentially unremarkable, and discharge planning was initiated accordingly with patient safely able to be discharged home with appropriate discharge instructions, pain control, and outpatient follow-up after all his questions were answered to his expressed satisfaction.   Discharge Condition: Good   Physical Examination:  Constitutional: Well appearing male, NAD Pulmonary: Normal effort, no respiratory distress Gastrointestinal: Soft, incisional soreness, non-tender, non-distended, no rebound/guarding Skin: Laparoscopic  incisions are CDI with steri-strips, no erythema or drainage    Allergies as of 02/24/2021   No Known Allergies      Medication List     STOP taking these medications    cyclobenzaprine 5 MG tablet Commonly known as: FLEXERIL       TAKE these medications    ibuprofen 600 MG tablet Commonly known as: ADVIL Take 1 tablet (600 mg total) by mouth every 8 (eight) hours as needed for moderate pain.   oxyCODONE 5 MG immediate release tablet Commonly known as: Oxy IR/ROXICODONE Take 1 tablet (5 mg total) by mouth every 4 (four) hours as needed for severe pain or breakthrough pain.          Follow-up Information     Donovan Kail, PA-C. Schedule an appointment as soon as possible for a visit in 2 week(s).   Specialty: Physician Assistant Why: s/p laparoscopic appendectomy Contact information: [address] [place] [address] [phone]                  Time spent on discharge management including discussion of hospital course, clinical condition, outpatient instructions, prescriptions, and follow up with the patient and members of the medical team: >30 minutes  -- Lynden Oxford , PA-C [facility]  02/24/2021, 8:18 AM [phone] M-F: 7am - 4pm

## 2021-02-24 NOTE — Telephone Encounter (Signed)
Patient called back with a fax number to fax his back to work note. So I faxed it with the following attention to Scarlette Calico  [phone] attention [employer] Calico

## 2021-02-25 LAB — SURGICAL PATHOLOGY

## 2021-02-28 ENCOUNTER — Telehealth: Payer: Self-pay | Admitting: *Deleted

## 2021-02-28 NOTE — Telephone Encounter (Signed)
Faxed FMLA to [employer] at [phone] 

## 2021-03-05 ENCOUNTER — Telehealth: Payer: Self-pay | Admitting: *Deleted

## 2021-03-05 ENCOUNTER — Other Ambulatory Visit: Payer: Self-pay

## 2021-03-05 ENCOUNTER — Encounter: Payer: Self-pay | Admitting: Physician Assistant

## 2021-03-05 ENCOUNTER — Ambulatory Visit (INDEPENDENT_AMBULATORY_CARE_PROVIDER_SITE_OTHER): Payer: BC Managed Care – PPO | Admitting: Physician Assistant

## 2021-03-05 VITALS — BP 113/73 | HR 71 | Temp 98.6°F | Ht 69.0 in | Wt 171.0 lb

## 2021-03-05 DIAGNOSIS — Z09 Encounter for follow-up examination after completed treatment for conditions other than malignant neoplasm: Secondary | ICD-10-CM

## 2021-03-05 DIAGNOSIS — K3580 Unspecified acute appendicitis: Secondary | ICD-10-CM

## 2021-03-05 NOTE — Patient Instructions (Addendum)
If you have any concerns or questions, please feel free to call our office.   GENERAL POST-OPERATIVE PATIENT INSTRUCTIONS   WOUND CARE INSTRUCTIONS:  Keep a dry clean dressing on the wound if there is drainage. The initial bandage may be removed after 24 hours.  Once the wound has quit draining you may leave it open to air.  If clothing rubs against the wound or causes irritation and the wound is not draining you may cover it with a dry dressing during the daytime.  Try to keep the wound dry and avoid ointments on the wound unless directed to do so.  If the wound becomes bright red and painful or starts to drain infected material that is not clear, please contact your physician immediately.  If the wound is mildly pink and has a thick firm ridge underneath it, this is normal, and is referred to as a healing ridge.  This will resolve over the next 4-6 weeks.  BATHING: You may shower if you have been informed of this by your surgeon. However, Please do not submerge in a tub, hot tub, or pool until incisions are completely sealed or have been told by your surgeon that you may do so.  DIET:  You may eat any foods that you can tolerate.  It is a good idea to eat a high fiber diet and take in plenty of fluids to prevent constipation.  If you do become constipated you may want to take a mild laxative or take ducolax tablets on a daily basis until your bowel habits are regular.  Constipation can be very uncomfortable, along with straining, after recent surgery.  ACTIVITY:  You are encouraged to cough and deep breath or use your incentive spirometer if you were given one, every 15-30 minutes when awake.  This will help prevent respiratory complications and low grade fevers post-operatively if you had a general anesthetic.  You may want to hug a pillow when coughing and sneezing to add additional support to the surgical area, if you had abdominal or chest surgery, which will decrease pain during these times.  You  are encouraged to walk and engage in light activity for the next two weeks.  You should not lift more than 10/15 pounds, until 03/26/2021 as it could put you at increased risk for complications.  Twenty pounds is roughly equivalent to a plastic bag of groceries. At that time- Listen to your body when lifting, if you have pain when lifting, stop and then try again in a few days. Soreness after doing exercises or activities of daily living is normal as you get back in to your normal routine.  MEDICATIONS:  Try to take narcotic medications and anti-inflammatory medications, such as tylenol, ibuprofen, naprosyn, etc., with food.  This will minimize stomach upset from the medication.  Should you develop nausea and vomiting from the pain medication, or develop a rash, please discontinue the medication and contact your physician.  You should not drive, make important decisions, or operate machinery when taking narcotic pain medication.  SUNBLOCK Use sun block to incision area over the next year if this area will be exposed to sun. This helps decrease scarring and will allow you avoid a permanent darkened area over your incision.

## 2021-03-05 NOTE — Progress Notes (Signed)
[facility] POST-OP OFFICE VISIT  03/05/2021  HPI: Riley Oconnell is a 40 y.o. male 10 days s/p laparoscopic appendectomy with Dr Lady Gary.   He has done well at home since surgery He is having minimal pain, mostly when he over exerts himself at home doing chores No fever, chills, nausea, emesis No issues with the incisions He is tolerating PO without issues No other complaints.   Vital signs: BP 113/73   Pulse 71   Temp 98.6 F (37 C) (Oral)   Ht 5\' 9"  (1.753 m)   Wt 171 lb (77.6 kg)   SpO2 95%   BMI 25.25 kg/m    Physical Exam: Constitutional: Well appearing male, NAD Abdomen: Soft, non-tender, non-distended, no rebound/guarding Skin: Laparoscopic incisions are well healed, no erythema or drainage  Assessment/Plan: This is a 40 y.o. male 10 days s/p laparoscopic appendectomy   - Pain control prn; OTC medications  - Reviewed wound care  - Reviewed lifting restrictions; 4 weeks; work note given  - Reviewed surgical pathology: Acute Appendicitis  - He can rtc on as needed basis   -- 20, PA-C [facility] 03/05/2021, 10:54 AM [phone] M-F: 7am - 4pm

## 2021-03-05 NOTE — Telephone Encounter (Signed)
Faxed to [facility] at [phone]

## 2021-03-12 ENCOUNTER — Encounter: Payer: Self-pay | Admitting: General Surgery

## 2021-04-01 ENCOUNTER — Encounter: Payer: Self-pay | Admitting: General Surgery

## 2021-06-19 ENCOUNTER — Ambulatory Visit
Admission: EM | Admit: 2021-06-19 | Discharge: 2021-06-19 | Disposition: A | Discharge status: Home / Self Care | Payer: BC Managed Care – PPO | Attending: Physician Assistant | Admitting: Physician Assistant

## 2021-06-19 ENCOUNTER — Other Ambulatory Visit: Payer: Self-pay

## 2021-06-19 ENCOUNTER — Encounter: Payer: Self-pay | Admitting: Licensed Clinical Social Worker

## 2021-06-19 DIAGNOSIS — U071 COVID-19: Secondary | ICD-10-CM | POA: Insufficient documentation

## 2021-06-19 DIAGNOSIS — R059 Cough, unspecified: Secondary | ICD-10-CM | POA: Diagnosis present

## 2021-06-19 DIAGNOSIS — R509 Fever, unspecified: Secondary | ICD-10-CM | POA: Diagnosis not present

## 2021-06-19 DIAGNOSIS — B349 Viral infection, unspecified: Secondary | ICD-10-CM | POA: Diagnosis not present

## 2021-06-19 HISTORY — DX: Cardiac murmur, unspecified: R01.1

## 2021-06-19 LAB — GROUP A STREP BY PCR: Group A Strep by PCR: NOT DETECTED

## 2021-06-19 NOTE — Discharge Instructions (Addendum)
Rest, push fluids, may alternate Tylenol ibuprofen as label directed for symptom management.  Your test is pending please check MyChart for results.  Quarantine until you know your results.Marland Kitchen

## 2021-06-19 NOTE — ED Triage Notes (Signed)
Pt c/o body aches, fever, cough, congestion x 2 days. Co worker had flu.

## 2021-06-19 NOTE — ED Provider Notes (Signed)
[facility]    CSN: [id number] Arrival date & time: 06/19/21  1219      History   Chief Complaint Chief Complaint  Patient presents with   Cough   Generalized Body Aches   Fever    HPI TAEVEON KEESLING is a 40 y.o. male.   Kem Parkinson, 40 year old male presents to urgent care chief complaint of cough, body aches, fever.  Patient states he was recently exposed to flu by coworker.  Requesting to get tested for flu COVID strep.  The history is provided by the patient. No language interpreter was used.   Past Medical History:  Diagnosis Date   Heart murmur     Patient Active Problem List   Diagnosis Date Noted   Viral syndrome 06/19/2021   Acute appendicitis 02/22/2021    Past Surgical History:  Procedure Laterality Date   HAND SURGERY Right    trigger finger   HAND TENDON SURGERY Right    LAPAROSCOPIC APPENDECTOMY N/A 02/23/2021   Procedure: APPENDECTOMY LAPAROSCOPIC;  Surgeon: Duanne Guess, MD;  Location: [facility] ORS;  Service: General;  Laterality: N/A;       Home Medications    Prior to Admission medications   Medication Sig Start Date End Date Taking? Authorizing Provider  ibuprofen (ADVIL) 600 MG tablet Take 1 tablet (600 mg total) by mouth every 8 (eight) hours as needed for moderate pain. 02/24/21  Yes Lynden Oxford R, PA-C  oxyCODONE (OXY IR/ROXICODONE) 5 MG immediate release tablet Take 1 tablet (5 mg total) by mouth every 4 (four) hours as needed for severe pain or breakthrough pain. 02/24/21   Donovan Kail, PA-C    Family History Family History  Problem Relation Age of Onset   Heart failure Father    Cancer Other    Diabetes Other     Social History Social History   Tobacco Use   Smoking status: Never   Smokeless tobacco: Never  Substance Use Topics   Alcohol use: Yes    Alcohol/week: 6.0 standard drinks    Types: 6 Cans of beer per week    Comment: occasional   Drug use: No     Allergies   Patient has no  known allergies.   Review of Systems Review of Systems  Constitutional:  Positive for fever.  HENT:  Positive for congestion.   Musculoskeletal:  Positive for myalgias.  All other systems reviewed and are negative.   Physical Exam Triage Vital Signs ED Triage Vitals  Enc Vitals Group     BP 06/19/21 1230 (!) 134/93     Pulse Rate 06/19/21 1230 85     Resp 06/19/21 1230 16     Temp 06/19/21 1230 99.3 F (37.4 C)     Temp Source 06/19/21 1230 Oral     SpO2 06/19/21 1230 100 %     Weight 06/19/21 1228 165 lb (74.8 kg)     Height 06/19/21 1228 5\' 9"  (1.753 m)     Head Circumference --      Peak Flow --      Pain Score 06/19/21 1228 3     Pain Loc --      Pain Edu? --      Excl. in [place]? --    No data found.  Updated Vital Signs BP (!) 134/93 (BP Location: Left Arm)    Pulse 85    Temp 99.3 F (37.4 C) (Oral)    Resp 16    Ht 5\' 9"  (  1.753 m)    Wt 165 lb (74.8 kg)    SpO2 100%    BMI 24.37 kg/m   Visual Acuity Right Eye Distance:   Left Eye Distance:   Bilateral Distance:    Right Eye Near:   Left Eye Near:    Bilateral Near:     Physical Exam Vitals and nursing note reviewed.  Constitutional:      General: He is not in acute distress.    Appearance: He is well-developed. He is not ill-appearing or toxic-appearing.  HENT:     Head: Normocephalic.     Right Ear: Tympanic membrane is retracted.     Left Ear: Tympanic membrane is retracted.     Nose: Mucosal edema and congestion present.     Mouth/Throat:     Lips: Pink.     Mouth: Mucous membranes are moist.     Pharynx: Uvula midline. Posterior oropharyngeal erythema present.  Eyes:     General: Lids are normal.     Extraocular Movements: Extraocular movements intact.     Conjunctiva/sclera: Conjunctivae normal.     Pupils: Pupils are equal, round, and reactive to light.  Cardiovascular:     Rate and Rhythm: Normal rate and regular rhythm.     Pulses: Normal pulses.     Heart sounds: Normal heart sounds.   Pulmonary:     Effort: Pulmonary effort is normal. No respiratory distress.     Breath sounds: Normal breath sounds and air entry. No decreased breath sounds or wheezing.  Abdominal:     General: There is no distension.     Palpations: Abdomen is soft.  Musculoskeletal:        General: Normal range of motion.     Cervical back: Normal range of motion.  Skin:    General: Skin is warm and dry.     Capillary Refill: Capillary refill takes less than 2 seconds.     Findings: No rash.  Neurological:     General: No focal deficit present.     Mental Status: He is alert and oriented to person, place, and time.     GCS: GCS eye subscore is 4. GCS verbal subscore is 5. GCS motor subscore is 6.     Cranial Nerves: No cranial nerve deficit.     Sensory: No sensory deficit.  Psychiatric:        Attention and Perception: Attention normal.        Mood and Affect: Mood normal.        Speech: Speech normal.        Behavior: Behavior normal. Behavior is cooperative.     UC Treatments / Results  Labs (all labs ordered are listed, but only abnormal results are displayed) Labs Reviewed  GROUP A STREP BY PCR  SARS CORONAVIRUS 2 (TAT 6-24 HRS)    EKG   Radiology No results found.  Procedures Procedures (including critical care time)  Medications Ordered in UC Medications - No data to display  Initial Impression / Assessment and Plan / UC Course  I have reviewed the triage vital signs and the nursing notes.  Pertinent labs & imaging results that were available during my care of the patient were reviewed by me and considered in my medical decision making (see chart for details).     Ddx: Viral illness(covid,flu) strep Final Clinical Impressions(s) / UC Diagnoses   Final diagnoses:  Viral syndrome     Discharge Instructions      Rest, push  fluids, may alternate Tylenol ibuprofen as label directed for symptom management.  Your test is pending please check MyChart for results.   Quarantine until you know your results..     ED Prescriptions   None    PDMP not reviewed this encounter.   Clancy Gourd, NP 06/19/21 1330

## 2021-06-20 LAB — SARS CORONAVIRUS 2 (TAT 6-24 HRS): SARS Coronavirus 2: POSITIVE — AB

## 2022-07-08 ENCOUNTER — Ambulatory Visit
Admission: EM | Admit: 2022-07-08 | Discharge: 2022-07-08 | Disposition: A | Discharge status: Home / Self Care | Payer: BC Managed Care – PPO | Attending: Emergency Medicine | Admitting: Emergency Medicine

## 2022-07-08 ENCOUNTER — Other Ambulatory Visit: Payer: Self-pay

## 2022-07-08 DIAGNOSIS — R6889 Other general symptoms and signs: Secondary | ICD-10-CM | POA: Diagnosis not present

## 2022-07-08 MED ORDER — IPRATROPIUM BROMIDE 0.06 % NA SOLN: 2.0000 | Freq: Four times a day (QID) | NASAL | 12 refills | Status: DC

## 2022-07-08 MED ORDER — PROMETHAZINE-DM 6.25-15 MG/5ML PO SYRP: 5.0000 mL | ORAL_SOLUTION | Freq: Four times a day (QID) | ORAL | 0 refills | Status: DC | PRN

## 2022-07-08 MED ORDER — KETOROLAC TROMETHAMINE 60 MG/2ML IM SOLN
60.0000 mg | Freq: Once | INTRAMUSCULAR | Status: AC
  Administered 2022-07-08: 60 mg via INTRAMUSCULAR

## 2022-07-08 MED ORDER — BENZONATATE 100 MG PO CAPS: 200.0000 mg | ORAL_CAPSULE | Freq: Three times a day (TID) | ORAL | 0 refills | Status: DC

## 2022-07-08 MED ORDER — OSELTAMIVIR PHOSPHATE 75 MG PO CAPS: 75.0000 mg | ORAL_CAPSULE | Freq: Two times a day (BID) | ORAL | 0 refills | Status: DC

## 2022-07-08 NOTE — Discharge Instructions (Addendum)
Take the Tamiflu twice daily for 5 days for treatment of influenza.  Use the Atrovent nasal spray, 2 squirts up each nostril every 6 hours, as needed for nasal congestion and runny nose.  Use over-the-counter Delsym, Zarbee's, or Robitussin during the day as needed for cough.  Take OTC Tylenol or Ibuprofen as needed for fever or pain.  Use the Tessalon Perles every 8 hours as needed for cough.  Taken with a small sip of water.  You may experience some numbness to your tongue or metallic taste in her mouth, this is normal.  Use the Promethazine DM cough syrup at bedtime as will make you drowsy but it should help dry up your postnasal drip and aid you in sleep and cough relief.  Return for reevaluation, or see your primary care provider, for new or worsening symptoms.

## 2022-07-08 NOTE — ED Triage Notes (Addendum)
Pt reports he has been sick of and on since Christmas. Last 4 days has had headache across forehead. Hx of migraines but not sure it feels like that. Tylenol helps. No nausea. Reports partner has the flu.

## 2022-07-08 NOTE — ED Provider Notes (Signed)
[facility]    CSN: [id number] Arrival date & time: 07/08/22  1120      History   Chief Complaint Chief Complaint  Patient presents with   Headache    HPI ARDA KEADLE is a 42 y.o. male.   HPI  42 year old male here for evaluation of headache.  Patient reports that he has been experiencing a headache in the frontal region for the last 4 days.  He does take Tylenol and it improves but does not resolve the headache.  He does have a history of migraines but states that it feels different.  Additionally he has been experiencing fevers at night with a Tmax of 102, nasal congestion with occasional nasal discharge, cough that is intermittently productive for brown sputum, and diarrhea.  The fever, nasal congestion, productive cough, diarrhea having on for last 2 days.  He denies any runny nose, sore throat, nausea, or vomiting.  His partner has tested positive for flu.  Patient's only significant past medical history is a heart murmur.  Past Medical History:  Diagnosis Date   Heart murmur     Patient Active Problem List   Diagnosis Date Noted   Viral syndrome 06/19/2021   Acute appendicitis 02/22/2021    Past Surgical History:  Procedure Laterality Date   HAND SURGERY Right    trigger finger   HAND TENDON SURGERY Right    LAPAROSCOPIC APPENDECTOMY N/A 02/23/2021   Procedure: APPENDECTOMY LAPAROSCOPIC;  Surgeon: Fredirick Maudlin, MD;  Location: [facility] ORS;  Service: General;  Laterality: N/A;       Home Medications    Prior to Admission medications   Medication Sig Start Date End Date Taking? Authorizing Provider  benzonatate (TESSALON) 100 MG capsule Take 2 capsules (200 mg total) by mouth every 8 (eight) hours. 07/08/22  Yes Margarette Canada, NP  ipratropium (ATROVENT) 0.06 % nasal spray Place 2 sprays into both nostrils 4 (four) times daily. 07/08/22  Yes Margarette Canada, NP  oseltamivir (TAMIFLU) 75 MG capsule Take 1 capsule (75 mg total) by mouth every 12  (twelve) hours. 07/08/22  Yes Margarette Canada, NP  promethazine-dextromethorphan (PROMETHAZINE-DM) 6.25-15 MG/5ML syrup Take 5 mLs by mouth 4 (four) times daily as needed. 07/08/22  Yes Margarette Canada, NP    Family History Family History  Problem Relation Age of Onset   Heart failure Father    Cancer Other    Diabetes Other     Social History Social History   Tobacco Use   Smoking status: Never   Smokeless tobacco: Never  Vaping Use   Vaping Use: Never used  Substance Use Topics   Alcohol use: Yes    Alcohol/week: 6.0 standard drinks of alcohol    Types: 6 Cans of beer per week    Comment: couple shots a week   Drug use: No     Allergies   Patient has no known allergies.   Review of Systems Review of Systems  Constitutional:  Positive for fever.  HENT:  Positive for congestion. Negative for ear pain, rhinorrhea and sore throat.   Respiratory:  Positive for cough. Negative for shortness of breath and wheezing.   Gastrointestinal:  Positive for diarrhea. Negative for nausea and vomiting.  Neurological:  Positive for headaches.     Physical Exam Triage Vital Signs ED Triage Vitals  Enc Vitals Group     BP 07/08/22 1324 124/79     Pulse Rate 07/08/22 1324 71     Resp 07/08/22 1324 16  Temp 07/08/22 1324 98.6 F (37 C)     Temp Source 07/08/22 1324 Oral     SpO2 07/08/22 1324 99 %     Weight 07/08/22 1321 165 lb (74.8 kg)     Height 07/08/22 1321 5\' 9"  (1.753 m)     Head Circumference --      Peak Flow --      Pain Score 07/08/22 1320 5     Pain Loc --      Pain Edu? --      Excl. in [place]? --    No data found.  Updated Vital Signs BP 124/79 (BP Location: Left Arm)   Pulse 71   Temp 98.6 F (37 C) (Oral)   Resp 16   Ht 5\' 9"  (1.753 m)   Wt 165 lb (74.8 kg)   SpO2 99%   BMI 24.37 kg/m   Visual Acuity Right Eye Distance:   Left Eye Distance:   Bilateral Distance:    Right Eye Near:   Left Eye Near:    Bilateral Near:     Physical Exam Vitals and  nursing note reviewed.  Constitutional:      Appearance: He is well-developed. He is not ill-appearing.  HENT:     Head: Normocephalic and atraumatic.     Right Ear: Tympanic membrane, ear canal and external ear normal. There is no impacted cerumen.     Left Ear: Tympanic membrane, ear canal and external ear normal. There is no impacted cerumen.     Nose: Congestion and rhinorrhea present.     Comments: His mucosa is erythematous and edematous with scant clear discharge in both nares.    Mouth/Throat:     Mouth: Mucous membranes are moist.     Pharynx: Oropharynx is clear. Posterior oropharyngeal erythema present. No oropharyngeal exudate.     Comments: Pharynx has mild erythema with clear postnasal drip. Cardiovascular:     Rate and Rhythm: Normal rate and regular rhythm.     Pulses: Normal pulses.     Heart sounds: Normal heart sounds. No murmur heard.    No friction rub. No gallop.  Pulmonary:     Effort: Pulmonary effort is normal.     Breath sounds: Normal breath sounds. No wheezing, rhonchi or rales.  Musculoskeletal:     Cervical back: Normal range of motion and neck supple.  Lymphadenopathy:     Cervical: No cervical adenopathy.  Skin:    General: Skin is warm and dry.     Capillary Refill: Capillary refill takes less than 2 seconds.     Findings: No erythema or rash.  Neurological:     General: No focal deficit present.     Mental Status: He is alert and oriented to person, place, and time.  Psychiatric:        Mood and Affect: Mood normal.        Behavior: Behavior normal.        Thought Content: Thought content normal.        Judgment: Judgment normal.      UC Treatments / Results  Labs (all labs ordered are listed, but only abnormal results are displayed) Labs Reviewed - No data to display  EKG   Radiology No results found.  Procedures Procedures (including critical care time)  Medications Ordered in UC Medications  ketorolac (TORADOL) injection 60  mg (60 mg Intramuscular Given 07/08/22 1403)    Initial Impression / Assessment and Plan / UC Course  I have  reviewed the triage vital signs and the nursing notes.  Pertinent labs & imaging results that were available during my care of the patient were reviewed by me and considered in my medical decision making (see chart for details).   Patient is a nontoxic-appearing 43 year old male here for evaluation of headache as outlined HPI above.  He also has a exposure to flu through his partner who is currently flu positive and he does endorse respiratory symptoms as well.  He states that he has been feeling ill since Christmas and has had the from headache for last 4 days.  He has been taking Tylenol which helps but does not totally relieve the headache.  He states it does not feel like his typical migraine headache.  On exam patient does have inflammation of his upper respiratory tree with inflamed nasal mucosa and also inflammation and erythema of the posterior oropharynx.  Given his flu exposure I will treat him empirically for flu with Tamiflu twice daily for 5 days.  He also has a history of heart murmur.  He can continue using over-the-counter Tylenol and ibuprofen as needed for fever or pain.  I will prescribe Atrovent nasal spray to help with nasal congestion.   Final Clinical Impressions(s) / UC Diagnoses   Final diagnoses:  Flu-like symptoms     Discharge Instructions      Take the Tamiflu twice daily for 5 days for treatment of influenza.  Use the Atrovent nasal spray, 2 squirts up each nostril every 6 hours, as needed for nasal congestion and runny nose.  Use over-the-counter Delsym, Zarbee's, or Robitussin during the day as needed for cough.  Take OTC Tylenol or Ibuprofen as needed for fever or pain.  Use the Tessalon Perles every 8 hours as needed for cough.  Taken with a small sip of water.  You may experience some numbness to your tongue or metallic taste in her mouth, this is  normal.  Use the Promethazine DM cough syrup at bedtime as will make you drowsy but it should help dry up your postnasal drip and aid you in sleep and cough relief.  Return for reevaluation, or see your primary care provider, for new or worsening symptoms.      ED Prescriptions     Medication Sig Dispense Auth. Provider   oseltamivir (TAMIFLU) 75 MG capsule Take 1 capsule (75 mg total) by mouth every 12 (twelve) hours. 10 capsule Becky Augusta, NP   benzonatate (TESSALON) 100 MG capsule Take 2 capsules (200 mg total) by mouth every 8 (eight) hours. 21 capsule Becky Augusta, NP   ipratropium (ATROVENT) 0.06 % nasal spray Place 2 sprays into both nostrils 4 (four) times daily. 15 mL Becky Augusta, NP   promethazine-dextromethorphan (PROMETHAZINE-DM) 6.25-15 MG/5ML syrup Take 5 mLs by mouth 4 (four) times daily as needed. 118 mL Becky Augusta, NP      PDMP not reviewed this encounter.   Becky Augusta, NP 07/08/22 1404

## 2022-08-08 ENCOUNTER — Inpatient Hospital Stay: Payer: BC Managed Care – PPO

## 2022-08-08 ENCOUNTER — Emergency Department: Payer: BC Managed Care – PPO

## 2022-08-08 ENCOUNTER — Encounter
Admission: EM | Disposition: A | Discharge status: Other Acute Inpatient Hospital | Payer: Self-pay | Source: Home / Self Care | Attending: Student in an Organized Health Care Education/Training Program

## 2022-08-08 ENCOUNTER — Emergency Department: Payer: BC Managed Care – PPO | Admitting: Anesthesiology

## 2022-08-08 ENCOUNTER — Other Ambulatory Visit: Payer: Self-pay

## 2022-08-08 ENCOUNTER — Inpatient Hospital Stay
Admission: EM | Admit: 2022-08-08 | Discharge: 2022-08-09 | DRG: 393 | Disposition: A | Discharge status: Other Acute Inpatient Hospital | Payer: BC Managed Care – PPO | Attending: Student in an Organized Health Care Education/Training Program | Admitting: Student in an Organized Health Care Education/Training Program

## 2022-08-08 DIAGNOSIS — K223 Perforation of esophagus: Secondary | ICD-10-CM | POA: Diagnosis not present

## 2022-08-08 DIAGNOSIS — T8189XA Other complications of procedures, not elsewhere classified, initial encounter: Secondary | ICD-10-CM | POA: Diagnosis not present

## 2022-08-08 DIAGNOSIS — J690 Pneumonitis due to inhalation of food and vomit: Secondary | ICD-10-CM | POA: Diagnosis present

## 2022-08-08 DIAGNOSIS — W44F3XA Food entering into or through a natural orifice, initial encounter: Secondary | ICD-10-CM | POA: Diagnosis present

## 2022-08-08 DIAGNOSIS — Y731 Therapeutic (nonsurgical) and rehabilitative gastroenterology and urology devices associated with adverse incidents: Secondary | ICD-10-CM | POA: Diagnosis not present

## 2022-08-08 DIAGNOSIS — J9601 Acute respiratory failure with hypoxia: Secondary | ICD-10-CM | POA: Diagnosis not present

## 2022-08-08 DIAGNOSIS — J9600 Acute respiratory failure, unspecified whether with hypoxia or hypercapnia: Secondary | ICD-10-CM | POA: Diagnosis not present

## 2022-08-08 DIAGNOSIS — T18128A Food in esophagus causing other injury, initial encounter: Secondary | ICD-10-CM | POA: Diagnosis present

## 2022-08-08 DIAGNOSIS — J982 Interstitial emphysema: Secondary | ICD-10-CM | POA: Diagnosis not present

## 2022-08-08 HISTORY — PX: ESOPHAGOGASTRODUODENOSCOPY (EGD) WITH PROPOFOL: SHX5813

## 2022-08-08 LAB — CBC WITH DIFFERENTIAL/PLATELET
Abs Immature Granulocytes: 0.02 10*3/uL (ref 0.00–0.07)
Basophils Absolute: 0.1 10*3/uL (ref 0.0–0.1)
Basophils Relative: 1 %
Eosinophils Absolute: 0.3 10*3/uL (ref 0.0–0.5)
Eosinophils Relative: 4 %
HCT: 43 % (ref 39.0–52.0)
Hemoglobin: 14.2 g/dL (ref 13.0–17.0)
Immature Granulocytes: 0 %
Lymphocytes Relative: 38 %
Lymphs Abs: 3.3 10*3/uL (ref 0.7–4.0)
MCH: 29.2 pg (ref 26.0–34.0)
MCHC: 33 g/dL (ref 30.0–36.0)
MCV: 88.5 fL (ref 80.0–100.0)
Monocytes Absolute: 0.6 10*3/uL (ref 0.1–1.0)
Monocytes Relative: 6 %
Neutro Abs: 4.5 10*3/uL (ref 1.7–7.7)
Neutrophils Relative %: 51 %
Platelets: 237 10*3/uL (ref 150–400)
RBC: 4.86 MIL/uL (ref 4.22–5.81)
RDW: 13.4 % (ref 11.5–15.5)
WBC: 8.8 10*3/uL (ref 4.0–10.5)
nRBC: 0 % (ref 0.0–0.2)

## 2022-08-08 LAB — COMPREHENSIVE METABOLIC PANEL
ALT: 73 U/L — ABNORMAL HIGH (ref 0–44)
AST: 63 U/L — ABNORMAL HIGH (ref 15–41)
Albumin: 4.4 g/dL (ref 3.5–5.0)
Alkaline Phosphatase: 70 U/L (ref 38–126)
Anion gap: 9 (ref 5–15)
BUN: 12 mg/dL (ref 6–20)
CO2: 22 mmol/L (ref 22–32)
Calcium: 8.6 mg/dL — ABNORMAL LOW (ref 8.9–10.3)
Chloride: 107 mmol/L (ref 98–111)
Creatinine, Ser: 1.09 mg/dL (ref 0.61–1.24)
GFR, Estimated: >60 mL/min (ref >60)
Glucose, Bld: 92 mg/dL (ref 70–99)
Potassium: 3.8 mmol/L (ref 3.5–5.1)
Sodium: 138 mmol/L (ref 135–145)
Total Bilirubin: 0.9 mg/dL (ref 0.3–1.2)
Total Protein: 8.2 g/dL — ABNORMAL HIGH (ref 6.5–8.1)

## 2022-08-08 SURGERY — ESOPHAGOGASTRODUODENOSCOPY (EGD) WITH PROPOFOL
Anesthesia: General

## 2022-08-08 MED ORDER — SUCCINYLCHOLINE CHLORIDE 200 MG/10ML IV SOSY
PREFILLED_SYRINGE | INTRAVENOUS | Status: DC | PRN
  Administered 2022-08-08: 100 mg via INTRAVENOUS

## 2022-08-08 MED ORDER — PIPERACILLIN-TAZOBACTAM 3.375 G IVPB 30 MIN: 3.3750 g | Freq: Four times a day (QID) | INTRAVENOUS | Status: DC

## 2022-08-08 MED ORDER — LIDOCAINE HCL (CARDIAC) PF 100 MG/5ML IV SOSY
PREFILLED_SYRINGE | INTRAVENOUS | Status: DC | PRN
  Administered 2022-08-08: 100 mg via INTRAVENOUS

## 2022-08-08 MED ORDER — PROPOFOL 500 MG/50ML IV EMUL
INTRAVENOUS | Status: DC | PRN
  Administered 2022-08-08: 150 ug/kg/min via INTRAVENOUS

## 2022-08-08 MED ORDER — MIDAZOLAM HCL 2 MG/2ML IJ SOLN
INTRAMUSCULAR | Status: AC
  Filled 2022-08-08: qty 2

## 2022-08-08 MED ORDER — PROPOFOL 1000 MG/100ML IV EMUL
INTRAVENOUS | Status: AC
  Filled 2022-08-08: qty 100

## 2022-08-08 MED ORDER — IOHEXOL 300 MG/ML  SOLN
75.0000 mL | Freq: Once | INTRAMUSCULAR | Status: AC | PRN
  Administered 2022-08-08: 75 mL via INTRAVENOUS

## 2022-08-08 MED ORDER — PROPOFOL 10 MG/ML IV BOLUS
INTRAVENOUS | Status: DC | PRN
  Administered 2022-08-08: 200 mg via INTRAVENOUS

## 2022-08-08 MED ORDER — SUCCINYLCHOLINE CHLORIDE 200 MG/10ML IV SOSY
PREFILLED_SYRINGE | INTRAVENOUS | Status: AC
  Filled 2022-08-08: qty 10

## 2022-08-08 MED ORDER — DOCUSATE SODIUM 100 MG PO CAPS: 100.0000 mg | ORAL_CAPSULE | Freq: Two times a day (BID) | ORAL | Status: DC | PRN

## 2022-08-08 MED ORDER — FENTANYL BOLUS VIA INFUSION: 50.0000 ug | INTRAVENOUS | Status: DC | PRN

## 2022-08-08 MED ORDER — ONDANSETRON HCL 4 MG/2ML IJ SOLN
INTRAMUSCULAR | Status: AC
  Filled 2022-08-08: qty 2

## 2022-08-08 MED ORDER — GLYCOPYRROLATE 0.2 MG/ML IJ SOLN
INTRAMUSCULAR | Status: AC
  Filled 2022-08-08: qty 1

## 2022-08-08 MED ORDER — PROPOFOL 1000 MG/100ML IV EMUL
0.0000 ug/kg/min | INTRAVENOUS | Status: DC
  Administered 2022-08-08: 10 ug/kg/min via INTRAVENOUS
  Filled 2022-08-08 (×2): qty 100

## 2022-08-08 MED ORDER — GLUCAGON HCL RDNA (DIAGNOSTIC) 1 MG IJ SOLR
1.0000 mg | Freq: Once | INTRAMUSCULAR | Status: AC
  Administered 2022-08-08: 1 mg via INTRAVENOUS
  Filled 2022-08-08: qty 1

## 2022-08-08 MED ORDER — DEXAMETHASONE SODIUM PHOSPHATE 10 MG/ML IJ SOLN
INTRAMUSCULAR | Status: AC
  Filled 2022-08-08: qty 1

## 2022-08-08 MED ORDER — DEXAMETHASONE SODIUM PHOSPHATE 10 MG/ML IJ SOLN
INTRAMUSCULAR | Status: DC | PRN
  Administered 2022-08-08: 10 mg via INTRAVENOUS

## 2022-08-08 MED ORDER — FENTANYL CITRATE (PF) 100 MCG/2ML IJ SOLN
INTRAMUSCULAR | Status: DC | PRN
  Administered 2022-08-08: 50 ug via INTRAVENOUS

## 2022-08-08 MED ORDER — DOCUSATE SODIUM 50 MG/5ML PO LIQD: 100.0000 mg | Freq: Two times a day (BID) | ORAL | Status: DC

## 2022-08-08 MED ORDER — ROCURONIUM BROMIDE 100 MG/10ML IV SOLN
INTRAVENOUS | Status: DC | PRN
  Administered 2022-08-08: 30 mg via INTRAVENOUS
  Administered 2022-08-08: 20 mg via INTRAVENOUS

## 2022-08-08 MED ORDER — MIDAZOLAM HCL 2 MG/2ML IJ SOLN
INTRAMUSCULAR | Status: DC | PRN
  Administered 2022-08-08: 4 mg via INTRAVENOUS
  Administered 2022-08-08: 2 mg via INTRAVENOUS

## 2022-08-08 MED ORDER — ONDANSETRON HCL 4 MG/2ML IJ SOLN
INTRAMUSCULAR | Status: DC | PRN
  Administered 2022-08-08: 4 mg via INTRAVENOUS

## 2022-08-08 MED ORDER — SODIUM CHLORIDE 0.9 % IV SOLN: INTRAVENOUS | Status: DC

## 2022-08-08 MED ORDER — PANTOPRAZOLE SODIUM 40 MG IV SOLR
40.0000 mg | Freq: Two times a day (BID) | INTRAVENOUS | Status: DC
  Administered 2022-08-08: 40 mg via INTRAVENOUS
  Filled 2022-08-08: qty 10

## 2022-08-08 MED ORDER — LIDOCAINE HCL (PF) 2 % IJ SOLN
INTRAMUSCULAR | Status: AC
  Filled 2022-08-08: qty 5

## 2022-08-08 MED ORDER — FENTANYL 2500MCG IN NS 250ML (10MCG/ML) PREMIX INFUSION
50.0000 ug/h | INTRAVENOUS | Status: DC
  Administered 2022-08-08: 50 ug/h via INTRAVENOUS
  Filled 2022-08-08 (×2): qty 250

## 2022-08-08 MED ORDER — POLYETHYLENE GLYCOL 3350 17 G PO PACK: 17.0000 g | PACK | Freq: Every day | ORAL | Status: DC | PRN

## 2022-08-08 MED ORDER — PIPERACILLIN-TAZOBACTAM 3.375 G IVPB
3.3750 g | Freq: Three times a day (TID) | INTRAVENOUS | Status: DC
  Administered 2022-08-09: 3.375 g via INTRAVENOUS
  Filled 2022-08-08: qty 50

## 2022-08-08 MED ORDER — GLYCOPYRROLATE 0.2 MG/ML IJ SOLN
INTRAMUSCULAR | Status: DC | PRN
  Administered 2022-08-08: .2 mg via INTRAVENOUS

## 2022-08-08 MED ORDER — FENTANYL CITRATE (PF) 100 MCG/2ML IJ SOLN
INTRAMUSCULAR | Status: AC
  Filled 2022-08-08: qty 2

## 2022-08-08 MED ORDER — POLYETHYLENE GLYCOL 3350 17 G PO PACK: 17.0000 g | PACK | Freq: Every day | ORAL | Status: DC

## 2022-08-08 NOTE — Progress Notes (Signed)
eLink Physician-Brief Progress Note Patient Name: Riley Oconnell DOB: [birth year] MRN: [id number]   Date of Service  08/08/2022  HPI/Events of Note  10 male, choked on salmon, urgent Endoscopy done after intubation-suspected esophageal perforation, going for a stat CT chest. If has perf, need to shift to higher center for CVTS procedure.  Camera: Discussed with RN, on propofol, with lung protective ventilation, MAP > 70, sats good, no tachycardia. Getting ready to go for CT scan soon. No intervention needed at this time.  Data Reviewed.   eICU Interventions  Notified ground team CCM. Continue current care plan.      Intervention Category Major Interventions: Other: (perforated esophagus) Evaluation Type: New Patient Evaluation  Elmer Sow 08/08/2022, 10:58 PM  01:39 AM GI and NP notes, CT scan results reviewed.

## 2022-08-08 NOTE — Consult Note (Signed)
Inpatient Consultation   Patient ID: Riley Oconnell is a 42 y.o. male.  Requesting Provider: Lucillie Garfinkel, MD  Date of Admission: 08/08/2022  Date of Consult: 08/08/22   Reason for Consultation: Food impaction   Patient's Chief Complaint:   Chief Complaint  Patient presents with   Choking    42 y/o CM who presents after choking episode after eating out at restaurant.  Pt was consuming salad and salmon when he started choking requiring the hemilich maneuver.  Pt able to breathe on his own, but having to intermittently spit out his secretions. Not tolerating po. Did not improve with glucagon. No atopic or asthma history Has had dysphagia issues with pills, but no issues with solids or liquids. No odynophagia. No weight loss.   Denies NSAIDs, Anti-plt agents, and anticoagulants Denies family history of gastrointestinal disease and malignancy Previous Endoscopies: None    Past Medical History:  Diagnosis Date   Heart murmur     Past Surgical History:  Procedure Laterality Date   HAND SURGERY Right    trigger finger   HAND TENDON SURGERY Right    LAPAROSCOPIC APPENDECTOMY N/A 02/23/2021   Procedure: APPENDECTOMY LAPAROSCOPIC;  Surgeon: Fredirick Maudlin, MD;  Location: [facility] ORS;  Service: General;  Laterality: N/A;    No Known Allergies  Family History  Problem Relation Age of Onset   Heart failure Father    Cancer Other    Diabetes Other     Social History   Tobacco Use   Smoking status: Never   Smokeless tobacco: Never  Vaping Use   Vaping Use: Never used  Substance Use Topics   Alcohol use: Yes    Alcohol/week: 6.0 standard drinks of alcohol    Types: 6 Cans of beer per week    Comment: couple shots a week   Drug use: No     Pertinent GI related history and allergies were reviewed with the patient  Review of Systems  Constitutional:  Negative for activity change, appetite change, chills, diaphoresis, fatigue, fever and unexpected weight change.   HENT:  Positive for trouble swallowing. Negative for voice change.   Respiratory:  Positive for choking. Negative for shortness of breath and wheezing.   Cardiovascular:  Negative for chest pain, palpitations and leg swelling.  Gastrointestinal:  Negative for abdominal distention, abdominal pain, anal bleeding, blood in stool, constipation, diarrhea, nausea and vomiting.  Musculoskeletal:  Negative for arthralgias and myalgias.  Skin:  Negative for color change and pallor.  Neurological:  Negative for dizziness, syncope and weakness.  Psychiatric/Behavioral:  Negative for confusion. The patient is not nervous/anxious.   All other systems reviewed and are negative.    Medications Home Medications No current facility-administered medications on file prior to encounter.   Current Outpatient Medications on File Prior to Encounter  Medication Sig Dispense Refill   benzonatate (TESSALON) 100 MG capsule Take 2 capsules (200 mg total) by mouth every 8 (eight) hours. 21 capsule 0   ipratropium (ATROVENT) 0.06 % nasal spray Place 2 sprays into both nostrils 4 (four) times daily. 15 mL 12   oseltamivir (TAMIFLU) 75 MG capsule Take 1 capsule (75 mg total) by mouth every 12 (twelve) hours. 10 capsule 0   promethazine-dextromethorphan (PROMETHAZINE-DM) 6.25-15 MG/5ML syrup Take 5 mLs by mouth 4 (four) times daily as needed. 118 mL 0   Pertinent GI related medications were reviewed with the patient  Inpatient Medications No current facility-administered medications for this encounter.  Current Outpatient Medications:  benzonatate (TESSALON) 100 MG capsule, Take 2 capsules (200 mg total) by mouth every 8 (eight) hours., Disp: 21 capsule, Rfl: 0   ipratropium (ATROVENT) 0.06 % nasal spray, Place 2 sprays into both nostrils 4 (four) times daily., Disp: 15 mL, Rfl: 12   oseltamivir (TAMIFLU) 75 MG capsule, Take 1 capsule (75 mg total) by mouth every 12 (twelve) hours., Disp: 10 capsule, Rfl: 0    promethazine-dextromethorphan (PROMETHAZINE-DM) 6.25-15 MG/5ML syrup, Take 5 mLs by mouth 4 (four) times daily as needed., Disp: 118 mL, Rfl: 0      Objective   Vitals:   08/08/22 1952 08/08/22 1955  BP:  138/76  Pulse: (!) 130   Resp: 20   Temp: 98 F (36.7 C)   TempSrc: Oral   SpO2: 100%   Weight: 74.8 kg   Height: 5\' 9"  (1.753 m)      Physical Exam Vitals and nursing note reviewed.  Constitutional:      General: He is not in acute distress.    Appearance: Normal appearance. He is not ill-appearing, toxic-appearing or diaphoretic.  HENT:     Head: Normocephalic and atraumatic.     Nose: Nose normal.     Mouth/Throat:     Mouth: Mucous membranes are moist.     Pharynx: Oropharynx is clear.  Eyes:     General: No scleral icterus.    Extraocular Movements: Extraocular movements intact.  Cardiovascular:     Rate and Rhythm: Normal rate and regular rhythm.     Heart sounds: Normal heart sounds. No murmur heard.    No friction rub. No gallop.  Pulmonary:     Effort: Pulmonary effort is normal. No respiratory distress.     Breath sounds: Normal breath sounds. No wheezing, rhonchi or rales.  Abdominal:     General: Abdomen is flat. Bowel sounds are normal. There is no distension.     Palpations: Abdomen is soft.     Tenderness: There is no abdominal tenderness. There is no guarding or rebound.  Musculoskeletal:     Cervical back: Neck supple.     Right lower leg: No edema.     Left lower leg: No edema.  Skin:    General: Skin is warm and dry.     Coloration: Skin is not jaundiced or pale.  Neurological:     General: No focal deficit present.     Mental Status: He is alert and oriented to person, place, and time. Mental status is at baseline.  Psychiatric:        Mood and Affect: Mood normal.        Behavior: Behavior normal.        Thought Content: Thought content normal.        Judgment: Judgment normal.     Laboratory Data Recent Labs  Lab 08/08/22 2000   WBC 8.8  HGB 14.2  HCT 43.0  PLT 237   Recent Labs  Lab 08/08/22 2000  NA 138  K 3.8  CL 107  CO2 22  BUN 12  CALCIUM 8.6*  PROT 8.2*  BILITOT 0.9  ALKPHOS 70  ALT 73*  AST 63*  GLUCOSE 92   No results for input(s): "INR" in the last 168 hours.  No results for input(s): "LIPASE" in the last 72 hours.      Imaging Studies: DG Chest Portable 2 Views  Result Date: 08/08/2022 CLINICAL DATA:  FB choking/esophageal impaction EXAM: CHEST  2 VIEW PORTABLE COMPARISON:  None Available. FINDINGS:  The heart size and mediastinal contours are within normal limits. Both lungs are clear. The visualized skeletal structures are unremarkable. No radiopaque foreign body. IMPRESSION: No active cardiopulmonary disease. Electronically Signed   By: Rolm Baptise M.D.   On: 08/08/2022 20:31    Assessment:   # food impaction  # pill dysphagia  Plan:  Npo now Labs reviewed Urgent egd with general anesthesia for food impaction relief  Esophagogastroduodenoscopy with possible biopsy, control of bleeding, polypectomy, and interventions as necessary has been discussed with the patient/patient representative. Informed consent was obtained from the patient/patient representative after explaining the indication, nature, and risks of the procedure including but not limited to death, bleeding, perforation, missed neoplasm/lesions, cardiorespiratory compromise, and reaction to medications. Opportunity for questions was given and appropriate answers were provided. Patient/patient representative has verbalized understanding is amenable to undergoing the procedure.  I personally performed the service.  Management of other medical comorbidities as per primary team  Thank you for allowing [place] to participate in this patient's care. Please don't hesitate to call if any questions or concerns arise.   Annamaria Helling, DO [facility]  Portions of the record may have been created with  voice recognition software. Occasional wrong-word or 'sound-a-like' substitutions may have occurred due to the inherent limitations of voice recognition software.  Read the chart carefully and recognize, using context, where substitutions may have occurred.

## 2022-08-08 NOTE — ED Notes (Signed)
Surgeon at bedside.  

## 2022-08-08 NOTE — Op Note (Signed)
[facility] Patient Name: Riley Oconnell Procedure Date: 08/08/2022 9:05 PM MRN: [id number] Account #: [id number] Date of Birth: [birth year] Admit Type: Inpatient Age: 42 Room: [facility] ENDO ROOM 4 Gender: Male Note Status: Finalized Instrument Name: Upper Endoscope [id number] Procedure:             Upper GI endoscopy Indications:           Foreign body in the esophagus Providers:             Annamaria Helling DO, DO Medicines:             Monitored Anesthesia Care Complications:         Suspicious for perforation. Vital signs are stable,                         but felt to have crepitice around the neck. Procedure:             Pre-Anesthesia Assessment:                        - Prior to the procedure, a History and Physical was                         performed, and patient medications and allergies were                         reviewed. The patient is competent. The risks and                         benefits of the procedure and the sedation options and                         risks were discussed with the patient. All questions                         were answered and informed consent was obtained.                         Patient identification and proposed procedure were                         verified by the physician, the nurse, the                         anesthesiologist, the anesthetist and the technician                         in the endoscopy suite. Mental Status Examination:                         alert and oriented. Airway Examination: normal                         oropharyngeal airway and neck mobility. Respiratory                         Examination: clear to auscultation. CV Examination:  RRR, no murmurs, no S3 or S4. Prophylactic                         Antibiotics: The patient does not require prophylactic                         antibiotics. Prior Anticoagulants: The patient has                          taken no anticoagulant or antiplatelet agents. ASA                         Grade Assessment: E - Emergency. After reviewing the                         risks and benefits, the patient was deemed in                         satisfactory condition to undergo the procedure. The                         anesthesia plan was to use general anesthesia.                         Immediately prior to administration of medications,                         the patient was re-assessed for adequacy to receive                         sedatives. The heart rate, respiratory rate, oxygen                         saturations, blood pressure, adequacy of pulmonary                         ventilation, and response to care were monitored                         throughout the procedure. The physical status of the                         patient was re-assessed after the procedure.                        After obtaining informed consent, the endoscope was                         passed under direct vision. Throughout the procedure,                         the patient's blood pressure, pulse, and oxygen                         saturations were monitored continuously. The Endoscope                         was introduced through the mouth, and advanced to the  second part of duodenum. The upper GI endoscopy was                         somewhat difficult due to presence of food and                         narrowing. Successful completion of the procedure was                         aided by dilation of the esophagus with TTS balloon.                         The patient tolerated the procedure well. Findings:      The duodenal bulb, first portion of the duodenum and second portion of       the duodenum were normal. Estimated blood loss: none.      A large amount of food (residue) was found in the entire examined       stomach. Estimated blood loss: none.      Food was found in the entire esophagus.  Mucosa suspicious for EOE      Initially, the gastroscope was not able to pass 25 cm the incisors. A       TTS balloon was used for 02-11-09 without change. A TTS Balloon with 12mm       was used and mucosal tear occurred. The gastroscope was then able to       pass this area and further food impaction resolved.      Esophagus was monitored with blood pooling and small air bubble raising       concern for possible perforation. At that time, no neck crepitice was       appreciated and scope was removed. Patient's vital signs remain stable. Impression:            - Normal duodenal bulb, first portion of the duodenum                         and second portion of the duodenum.                        - A large amount of food (residue) in the stomach.                        - Food in the esophagus.                        - No specimens collected. Recommendation:        - Admit the patient to ICU for a suspected                         complication.                        - STAT CT has been ordered to evaluate for free air.                         If free air present, he will need transfer to facility  to with cardiothoracic surgery. I have discussed the                         case with the pulmonary critical care provider.                        Will start patient on zosyn                        - NPO.                        - Start on protonix iv                        - DO not place OGT or NGT.                        - The findings and recommendations were discussed with                         the patient's family.                        - Patient to remain intubated. Procedure Code(s):     --- Professional ---                        [phone], Esophagogastroduodenoscopy, flexible,                         transoral; diagnostic, including collection of                         specimen(s) by brushing or washing, when performed                         (separate  procedure) Diagnosis Code(s):     --- Professional ---                        Z76.734L, Food in esophagus causing other injury,                         initial encounter                        T18.108A, Unspecified foreign body in esophagus                         causing other injury, initial encounter CPT copyright 2022 American Medical Association. All rights reserved. The codes documented in this report are preliminary and upon coder review may  be revised to meet current compliance requirements. Attending Participation:      I personally performed the entire procedure. Volney American, DO Annamaria Helling DO, DO 08/08/2022 10:45:29 PM This report has been signed electronically. Number of Addenda: 0 Note Initiated On: 08/08/2022 9:05 PM Estimated Blood Loss:  Estimated blood loss was minimal.      [facility]

## 2022-08-08 NOTE — H&P (Incomplete)
NAME:  Riley Oconnell, MRN:  [id number], DOB:  May 30, 1981, LOS: 0 ADMISSION DATE:  08/08/2022, CONSULTATION DATE:  08/08/22 REFERRING MD:  Lucillie Garfinkel MD  CHIEF COMPLAINT:  Aspiration     HPI  42 y.o with significant PMH of appendicitis status post appendectomy who presented to the ED with chief complaints of choking incident with possible aspiration.  Per ED reports, patient was at a restaurant eating solid and okra when he complained of choking sensation.  Patient had bystander perform Heimlich maneuvers with no relief therefore he was brought to the ED for further evaluation.   ED Course: Initial vital signs showed HR of 130  beats/minute, BP 138/76 mm Hg, the RR 20 breaths/minute, and the oxygen saturation 100% on  RA and a temperature of 98 F (36.7C). Patient was initially unable to speak clearly and was having intermittent episodes of spitting out his secretions but still able to maintain his airway.  Glucagon was administered with no relief.  Due to persistent symptoms, GI was consulted for evaluation patient at the bedside and recommended urgent EGD with general anesthesia for food impaction relief.  Pertinent Labs/Diagnostics Findings: Chemistry:Na+/ K+: 138/3.8 glucose: 92 BUN/Cr.:12/1.09:  AST/ALT: 63/73 CBC: Unremarkable Imaging:  CXR> CTA Chest> pending, Medications Administered: Glucagon  Patient was taken emergently to the OR for endoscopy.  Postprocedure there was concerns for intraprocedure esophageal perforation therefore stat CT of the neck/chest ordered.  Patient remained intubated and was transferred to the ICU for close monitoring pending CT.  PCCM consulted  Past Medical History  Appendicitis status post appendectomy  Significant Hospital Events   2/3: Presented with choking incident not relieved with glucagon, patient taken urgently to the OR for endoscopy.  Concerns for intraprocedure esophageal perforation.  Patient remained intubated and transferred to the ICU  pending CT.  PCCM consulted  Consults:  GI  Procedures:  08/08/22: Upper GI endoscopy for possible foreign body in the esophagus  Significant Diagnostic Tests:  08/08/22: Chest Xray> 08/08/22: CTA abdomen and pelvis> 08/08/22: CTA Chest>  Micro Data:  2/3: SARS-CoV-2 PCR> pending 2/3: Influenza PCR>pending  Antimicrobials:  Zosyn 2/3>  OBJECTIVE  Blood pressure 125/89, pulse 81, temperature (!) 97.4 F (36.3 C), temperature source Temporal, resp. rate 18, height 5\' 9"  (1.753 m), weight 74.8 kg, SpO2 99 %.    Vent Mode: PRVC FiO2 (%):  [40 %] 40 % Vt Set:  [430 mL] 430 mL PEEP:  [5 cmH20] 5 cmH20   Intake/Output Summary (Last 24 hours) at 08/08/2022 2345 Last data filed at 08/08/2022 2251 Gross per 24 hour  Intake 200 ml  Output --  Net 200 ml   Filed Weights   08/08/22 1952 08/08/22 2138  Weight: 74.8 kg 74.8 kg   Physical Examination  GENERAL:42 year-old critically ill patient lying in the bed Intubated and Sedated EYES: Pupils equal, round, reactive to light and accommodation. No scleral icterus. Extraocular muscles intact.  HEENT: Head atraumatic, normocephalic. Oropharynx and nasopharynx clear.  NECK:  Supple, no jugular venous distention. No thyroid enlargement, no tenderness.  LUNGS: Decreased breath sounds bilaterally, no wheezing, rales, rhonchi or crepitation. No use of accessory muscles of respiration.  CARDIOVASCULAR: S1, S2 normal. No murmurs, rubs, or gallops.  ABDOMEN: Soft, nontender, nondistended. Bowel sounds hyperactive No organomegaly or mass.  EXTREMITIES:atraumatic. No swelling or erythema. Unable to assess Muscle strength is 5/5 bilaterally. Capillary refill > 3 seconds in all extremities. Pulses palpable distally. NEUROLOGIC:The patient is  Intubated and sedated. UTA Motor function.  Withdraws extremities to noxious stimuli. Cranial nerves are intact. Gait deferred.  SKIN: No obvious rash, lesion, or ulcer.   Labs/imaging that I havepersonally reviewed   (right click and "Reselect all SmartList Selections" daily)     Labs   CBC: Recent Labs  Lab 08/08/22 2000  WBC 8.8  NEUTROABS 4.5  HGB 14.2  HCT 43.0  MCV 88.5  PLT 627    Basic Metabolic Panel: Recent Labs  Lab 08/08/22 2000  NA 138  K 3.8  CL 107  CO2 22  GLUCOSE 92  BUN 12  CREATININE 1.09  CALCIUM 8.6*   GFR: Estimated Creatinine Clearance: 91 mL/min (by C-G formula based on SCr of 1.09 mg/dL). Recent Labs  Lab 08/08/22 2000  WBC 8.8    Liver Function Tests: Recent Labs  Lab 08/08/22 2000  AST 63*  ALT 73*  ALKPHOS 70  BILITOT 0.9  PROT 8.2*  ALBUMIN 4.4   No results for input(s): "LIPASE", "AMYLASE" in the last 168 hours. No results for input(s): "AMMONIA" in the last 168 hours.  ABG No results found for: "PHART", "PCO2ART", "PO2ART", "HCO3", "TCO2", "ACIDBASEDEF", "O2SAT"   Coagulation Profile: No results for input(s): "INR", "PROTIME" in the last 168 hours.  Cardiac Enzymes: No results for input(s): "CKTOTAL", "CKMB", "CKMBINDEX", "TROPONINI" in the last 168 hours.  HbA1C: No results found for: "HGBA1C"  CBG: No results for input(s): "GLUCAP" in the last 168 hours.  Review of Systems:   UNABLE TO OBTAIN PATIENT IS INTUBATED  Past Medical History  He,  has a past medical history of Heart murmur.   Surgical History    Past Surgical History:  Procedure Laterality Date   HAND SURGERY Right    trigger finger   HAND TENDON SURGERY Right    LAPAROSCOPIC APPENDECTOMY N/A 02/23/2021   Procedure: APPENDECTOMY LAPAROSCOPIC;  Surgeon: Fredirick Maudlin, MD;  Location: [facility] ORS;  Service: General;  Laterality: N/A;     Social History   reports that he has never smoked. He has never used smokeless tobacco. He reports current alcohol use of about 6.0 standard drinks of alcohol per week. He reports that he does not use drugs.   Family History   His family history includes Cancer in an other family member; Diabetes in an other family  member; Heart failure in his father.   Allergies No Known Allergies   Home Medications  Prior to Admission medications   Medication Sig Start Date End Date Taking? Authorizing Provider  benzonatate (TESSALON) 100 MG capsule Take 2 capsules (200 mg total) by mouth every 8 (eight) hours. Patient not taking: Reported on 08/08/2022 07/08/22   Margarette Canada, NP  ipratropium (ATROVENT) 0.06 % nasal spray Place 2 sprays into both nostrils 4 (four) times daily. 07/08/22   Margarette Canada, NP  oseltamivir (TAMIFLU) 75 MG capsule Take 1 capsule (75 mg total) by mouth every 12 (twelve) hours. Patient not taking: Reported on 08/08/2022 07/08/22   Margarette Canada, NP  promethazine-dextromethorphan (PROMETHAZINE-DM) 6.25-15 MG/5ML syrup Take 5 mLs by mouth 4 (four) times daily as needed. Patient not taking: Reported on 08/08/2022 07/08/22   Margarette Canada, NP  Scheduled Meds:  docusate  100 mg Per Tube BID   pantoprazole (PROTONIX) IV  40 mg Intravenous Q12H   polyethylene glycol  17 g Per Tube Daily   Continuous Infusions:  fentaNYL infusion INTRAVENOUS 50 mcg/hr (08/08/22 2321)   [START ON 08/10/2022] fluconazole (DIFLUCAN) IV     fluconazole (DIFLUCAN) IV 800 mg (08/09/22 0121)  piperacillin-tazobactam (ZOSYN)  IV     propofol (DIPRIVAN) infusion 10 mcg/kg/min (08/08/22 2308)   PRN Meds:.docusate sodium, fentaNYL, polyethylene glycol   Active Hospital Problem list     Assessment & Plan:  #Acute Respiratory Failure Requiring Mechanical Ventilation Post Procedure # Suspected Esophageal perforation # Aspiration pneumonitis  Presented  with Acute onset dysphagia/failure and inability to tolerate or handle secretions requiring urgent endoscopy for foreign body disimpaction.required mechanical ventilation.  Due to suspected esophageal perforation CT chest/soft tissue neck pending for further evaluation.   -Full vent support, implement lung protective strategies -Plateau pressures less than 30 cm H20 -Wean FiO2 &  PEEP as tolerated to maintain O2 sats >92% -Follow intermittent Chest X-ray & ABG as needed -Spontaneous Breathing Trials when respiratory parameters met and mental status permits -Implement VAP Bundle -As needed bronchodilators -PAD protocol: Fentanyl and propofol for sedation  #Foreign body aspiration s/p upper endoscopy #Suspected esophageal perforation -CT chest and CT soft tissue neck pending for further evaluation -Supplemental oxygen as needed, to maintain SpO2 > 90% -Obtain blood cultures, lactate and procalcitonin f/u cultures, trend lactic/ PCT -Monitor WBC/ fever curve -Start empiric antibiotics with zosyn (intra-abdominal)  -IVF hydration as needed: -Pressors for MAP goal >65 -Strict I/O's -Plan transfer to tertiary care for repair pending CT as above   Best practice:  Diet:  NPO Pain/Anxiety/Delirium protocol (if indicated): Yes (RASS goal 0) VAP protocol (if indicated): Yes DVT prophylaxis: LMWH GI prophylaxis: PPI Glucose control:  SSI No Central venous access:  Yes, and it is still needed Arterial line:  Yes, and it is still needed Foley:  Yes, and it is still needed Mobility:  bed rest  PT consulted: N/A Last date of multidisciplinary goals of care discussion [08/08/22] Code Status:  full code Disposition: ICU   = Goals of Care = Code Status Order: FULL  Primary Emergency ContactNorrin, [place], Home Phone: [phone] Wishes to pursue full aggressive treatment and intervention options, including CPR and intubation, but goals of care will be addressed on going with family if that should become necessary.  Critical care time: 6 minutes        Rufina Falco DNP, CCRN, FNP-C, AGACNP-BC Acute Care & Family Nurse Practitioner [facility] PCCM on call pager [phone]

## 2022-08-08 NOTE — ED Triage Notes (Signed)
Pt to ED from rest[facility]arant for possible choking. Was eating salad and okra and got choked. Someone tried to do the heimlich as well. Pt feels he still has something stuck in this throat. Vitals for EMS normal. Pt ambulatory from ambulance to ED room. Pt did consume alcohol with dinner.  MD at bedside. 748pm

## 2022-08-08 NOTE — ED Notes (Signed)
Pt was initially unable to speak clearly. At this time he is speaking clearly and moving good air.

## 2022-08-08 NOTE — Anesthesia Procedure Notes (Signed)
Procedure Name: Intubation Date/Time: 08/08/2022 10:00 PM  Performed by: Rolla Plate, CRNAPre-anesthesia Checklist: Patient identified, Patient being monitored, Timeout performed, Emergency Drugs available and Suction available Patient Re-evaluated:Patient Re-evaluated prior to induction Oxygen Delivery Method: Circle system utilized Preoxygenation: Pre-oxygenation with 100% oxygen Induction Type: IV induction Ventilation: Mask ventilation without difficulty Laryngoscope Size: McGraph and 4 Grade View: Grade I Tube type: Oral Tube size: 7.5 mm Number of attempts: 1 Airway Equipment and Method: Stylet and Video-laryngoscopy Placement Confirmation: ETT inserted through vocal cords under direct vision, positive ETCO2 and breath sounds checked- equal and bilateral Secured at: 22 cm Tube secured with: Tape Dental Injury: Teeth and Oropharynx as per pre-operative assessment

## 2022-08-08 NOTE — Anesthesia Preprocedure Evaluation (Signed)
Anesthesia Evaluation  Patient identified by MRN, date of birth, ID band Patient awake    Reviewed: Allergy & Precautions, NPO status , Patient's Chart, lab work & pertinent test results  History of Anesthesia Complications Negative for: history of anesthetic complications  Airway Mallampati: II  TM Distance: >3 FB Neck ROM: Full    Dental no notable dental hx.    Pulmonary neg pulmonary ROS   Pulmonary exam normal breath sounds clear to auscultation       Cardiovascular Exercise Tolerance: Good (-) hypertension(-) angina (-) CAD, (-) Past MI and (-) Cardiac Stents Normal cardiovascular exam(-) dysrhythmias + Valvular Problems/Murmurs  Rhythm:Regular Rate:Normal     Neuro/Psych negative neurological ROS  negative psych ROS   GI/Hepatic negative GI ROS, Neg liver ROS,,,  Endo/Other  negative endocrine ROS    Renal/GU negative Renal ROS  negative genitourinary   Musculoskeletal negative musculoskeletal ROS (+)    Abdominal Normal abdominal exam  (+)   Peds negative pediatric ROS (+)  Hematology negative hematology ROS (+)   Anesthesia Other Findings Past Medical History: No date: Heart murmur   Reproductive/Obstetrics negative OB ROS                             Anesthesia Physical Anesthesia Plan  ASA: 2 and emergent  Anesthesia Plan: General   Post-op Pain Management:    Induction: Intravenous, Rapid sequence and Cricoid pressure planned  PONV Risk Score and Plan: 2 and Dexamethasone, Ondansetron, Midazolam and Treatment may vary due to age or medical condition  Airway Management Planned: Oral ETT  Additional Equipment:   Intra-op Plan:   Post-operative Plan: Extubation in OR  Informed Consent: I have reviewed the patients History and Physical, chart, labs and discussed the procedure including the risks, benefits and alternatives for the proposed anesthesia with the  patient or authorized representative who has indicated his/her understanding and acceptance.     Dental Advisory Given  Plan Discussed with: Anesthesiologist, CRNA and Surgeon  Anesthesia Plan Comments: (Patient consented for risks of anesthesia including but not limited to:  - adverse reactions to medications - damage to eyes, teeth, lips or other oral mucosa - nerve damage due to positioning  - sore throat or hoarseness - Damage to heart, brain, nerves, lungs, other parts of body or loss of life  Patient voiced understanding.)        Anesthesia Quick Evaluation

## 2022-08-08 NOTE — ED Provider Notes (Addendum)
[facility] Provider Note    Event Date/Time   First MD Initiated Contact with Patient 08/08/22 1956     (approximate)   History   Choking   HPI  Riley Oconnell is a 42 y.o. male   Past medical history of no significant past medical history presents with a choking incident while eating dinner tonight salad and salmon.  He had a similar occurrence years ago on a grape but was never medically evaluated.  He has a sensation that he cannot swallow without regurgitating.  No respiratory distress.  External Medical Documents Reviewed: [facility] department visit dated 07/08/2022 for headache      Physical Exam   Triage Vital Signs: ED Triage Vitals  Enc Vitals Group     BP 08/08/22 1955 138/76     Pulse Rate 08/08/22 1952 (!) 130     Resp 08/08/22 1952 20     Temp 08/08/22 1952 98 F (36.7 C)     Temp Source 08/08/22 1952 Oral     SpO2 08/08/22 1952 100 %     Weight 08/08/22 1952 165 lb (74.8 kg)     Height 08/08/22 1952 5\' 9"  (1.753 m)     Head Circumference --      Peak Flow --      Pain Score 08/08/22 1952 0     Pain Loc --      Pain Edu? --      Excl. in [place]? --     Most recent vital signs: Vitals:   08/08/22 2030 08/08/22 2138  BP: 106/72 125/89  Pulse: 80 81  Resp: 20 18  Temp:  (!) 97.4 F (36.3 C)  SpO2: 100% 99%    General: Awake, tripoding gagging spitting up occasionally. CV:  Good peripheral perfusion.  Resp:  Normal effort.  Abd:  No distention.  Other:  Lungs clear to auscultation bilaterally with no focality or wheezing.  Abdomen soft and nontender to palpation all quadrants.   ED Results / Procedures / Treatments   Labs (all labs ordered are listed, but only abnormal results are displayed) Labs Reviewed  COMPREHENSIVE METABOLIC PANEL - Abnormal; Notable for the following components:      Result Value   Calcium 8.6 (*)    Total Protein 8.2 (*)    AST 63 (*)    ALT 73 (*)    All other components within normal  limits  CBC WITH DIFFERENTIAL/PLATELET     I ordered and reviewed the above labs they are notable for mildly elevated LFTs   RADIOLOGY I independently reviewed and interpreted this x-ray see no obvious focalities or pneumothorax.   PROCEDURES:  Critical Care performed: Yes, see critical care procedure note(s)  .Critical Care  Performed by: Lucillie Garfinkel, MD Authorized by: Lucillie Garfinkel, MD   Critical care provider statement:    Critical care time (minutes):  30   Critical care was time spent personally by me on the following activities:  Development of treatment plan with patient or surrogate, discussions with consultants, evaluation of patient's response to treatment, examination of patient, ordering and review of laboratory studies, ordering and review of radiographic studies, ordering and performing treatments and interventions, pulse oximetry, re-evaluation of patient's condition and review of old charts    [place] ED: Medications  0.9 %  sodium chloride infusion ( Intravenous Continued from Pre-op 08/08/22 2144)  piperacillin-tazobactam (ZOSYN) IVPB 3.375 g (has no administration in time range)  glucagon (human recombinant) (  GLUCAGEN) injection 1 mg (1 mg Intravenous Given 08/08/22 2005)    External physician / consultants:  I spoke with Dr. Virgina Jock with gastroenterology regarding care plan for this patient.   IMPRESSION / MDM / ASSESSMENT AND PLAN / ED COURSE  I reviewed the triage vital signs and the nursing notes.                                Patient's presentation is most consistent with acute presentation with potential threat to life or bodily function.  Differential diagnosis includes, but is not limited to, esophageal impaction, food impaction, tracheal foreign body, perforation   The patient is on the cardiac monitor to evaluate for evidence of arrhythmia and/or significant heart rate changes.  MDM: Patient with likely esophageal impaction, got  glucagon without effect, maintaining airway, spitting up secretions, I contacted gastroenterology who will take him to the endoscopy suite for disimpaction.  Got a call from Dr. Virgina Jock from the endoscopy suite with concerns for perforation, he is going to CT scan and I will coordinate with our ICU whether disposition to come back down to the emergency department or directly to ICU.         FINAL CLINICAL IMPRESSION(S) / ED DIAGNOSES   Final diagnoses:  Food impaction of esophagus, initial encounter     Rx / DC Orders   ED Discharge Orders     None        Note:  This document was prepared using Dragon voice recognition software and may include unintentional dictation errors.    Lucillie Garfinkel, MD 08/08/22 9326    Lucillie Garfinkel, MD 08/08/22 [phone]

## 2022-08-08 NOTE — Transfer of Care (Signed)
Immediate Anesthesia Transfer of Care Note  Patient: Riley Oconnell  Procedure(s) Performed: ESOPHAGOGASTRODUODENOSCOPY (EGD) WITH PROPOFOL  Patient Location: ICU  Anesthesia Type:General  Level of Consciousness: Patient remains intubated per anesthesia plan  Airway & Oxygen Therapy: Patient remains intubated per anesthesia plan and Patient placed on Ventilator (see vital sign flow sheet for setting)  Post-op Assessment: Report given to RN and Post -op Vital signs reviewed and stable  Post vital signs: Reviewed  Last Vitals:  Vitals Value Taken Time  BP 93/62   Temp 97.62f   Pulse 99   Resp 20   SpO2 99     Last Pain:  Vitals:   08/08/22 2138  TempSrc: Temporal  PainSc: 0-No pain         Complications: No notable events documented.

## 2022-08-08 NOTE — Progress Notes (Signed)
GI Post op note  See endo note for further details.  Concern intraprocedure for possible esophageal perforation.  STAT Ct of the neck/chest have been ordered Zosyn and Protonix ordered PCCM consulted and patient to remain intubated in icu Post-op, pt's vital signs remain stable. Crepitus felt around the neck. Patient's husband has been updated in the lobby.  If CT is positive, pt will need to be transferred to facility with cardiothoracic surgery presence. Discussed with pulmonary critical care provider  Ronne Binning, [facility]

## 2022-08-08 NOTE — Progress Notes (Signed)
RT transported pt from ICU 20 to CT scan via vent and back without incident.

## 2022-08-09 ENCOUNTER — Inpatient Hospital Stay: Payer: BC Managed Care – PPO

## 2022-08-09 DIAGNOSIS — J9601 Acute respiratory failure with hypoxia: Secondary | ICD-10-CM

## 2022-08-09 MED ORDER — FLUCONAZOLE IN SODIUM CHLORIDE 400-0.9 MG/200ML-% IV SOLN: 400.0000 mg | INTRAVENOUS | Status: DC

## 2022-08-09 MED ORDER — PIPERACILLIN-TAZOBACTAM 4.5 G IVPB
4.5000 g | Freq: Four times a day (QID) | INTRAVENOUS | Status: DC
  Filled 2022-08-09: qty 100

## 2022-08-09 MED ORDER — FLUCONAZOLE IN SODIUM CHLORIDE 400-0.9 MG/200ML-% IV SOLN
800.0000 mg | Freq: Once | INTRAVENOUS | Status: AC
  Administered 2022-08-09: 800 mg via INTRAVENOUS
  Filled 2022-08-09: qty 400

## 2022-08-09 NOTE — Discharge Summary (Signed)
Physician Discharge Summary  Patient ID: Riley Oconnell MRN: [id number] DOB/AGE: [birth year] 42 y.o.  Admit date: 08/08/2022 Discharge date: 08/09/2022    Discharge Diagnoses:                                                                        DISCHARGE PLAN BY DIAGNOSIS    #Acute Respiratory Failure Requiring Mechanical Ventilation Post Procedure # Suspected Esophageal perforation # Aspiration pneumonitis   Presented  with Acute onset dysphagia/failure and inability to tolerate or handle secretions requiring urgent endoscopy for foreign body disimpaction.required mechanical ventilation.  Due to suspected esophageal perforation CT chest/soft tissue neck pending for further evaluation.    -Full vent support, implement lung protective strategies -Plateau pressures less than 30 cm H20 -Wean FiO2 & PEEP as tolerated to maintain O2 sats >92% -Follow intermittent Chest X-ray & ABG as needed -Spontaneous Breathing Trials when respiratory parameters met and mental status permits -Implement VAP Bundle -As needed bronchodilators -PAD protocol: Fentanyl and propofol for sedation   #Foreign body aspiration s/p upper endoscopy #Suspected esophageal perforation  CT chest and CT soft tissue neck  shows Extensive pneumomediastinum, Pneumatosis in the stomach wall, small amount of free air adjacent to the liver, and extensive gas presumably in the retroperitoneum. Pneumatosis also noted in the esophageal wall,presumably from recent EGD and esophageal stretching.  -Supplemental oxygen as needed, to maintain SpO2 > 90% -Obtain blood cultures, lactate and procalcitonin f/u cultures, trend lactic/ PCT -Monitor WBC/ fever curve -Start empiric antibiotics with zosyn (intra-abdominal), will add fluconazole per CT surgeon's recommendation -Start PPI Protonix -IVF hydration as needed: -Pressors for MAP goal >65 -Strict I/O's -Discussed with transfer center both at [facility],  Images pushed  through PACS to both [facility] and [facility] Surgeon who both reviewed the images.  [facility] currently at capacity.  Spoke with [facility] CT surgeon Dr. Laverta [place] who reviewed the images and accepted patient for urgent transfer.  Per CT surgeon, recommends patient remain intubated and sedated for transfer and adding fluconazole to current antibiotics regimen.                 DISCHARGE SUMMARY   Riley Oconnell is a 42 y.o. y/o male with a PMH of appendicitis status post appendectomy who presented to the ED with chief complaints of choking incident with possible aspiration.   Per ED reports, patient was at a restaurant eating solid and okra when he complained of choking sensation.  Patient had bystander perform Heimlich maneuvers with no relief therefore he was brought to the ED for further evaluation.   ED Course: Initial vital signs showed HR of 130  beats/minute, BP 138/76 mm Hg, the RR 20 breaths/minute, and the oxygen saturation 100% on  RA and a temperature of 98 F (36.7C). Patient was initially unable to speak clearly and was having intermittent episodes of spitting out his secretions but still able to maintain his airway.  Glucagon was administered with no relief.  Due to persistent symptoms, GI was consulted for evaluation patient at the bedside and recommended urgent EGD with general anesthesia for food impaction relief.   Pertinent Labs/Diagnostics Findings: Chemistry:Na+/ K+: 138/3.8 glucose: 92 BUN/Cr.:12/1.09:  AST/ALT: 63/73 CBC: Unremarkable Imaging:  CXR> CTA Chest>  pending, Medications Administered: Glucagon   Patient was taken emergently to the OR for endoscopy.  Postprocedure there was concerns for intraprocedure esophageal perforation therefore stat CT of the neck/chest ordered.  Patient remained intubated and was transferred to the ICU for close monitoring pending CT.  PCCM consulted   SIGNIFICANT DIAGNOSTIC STUDIES 08/08/22:CT SOFT TISSUE NECK W CONTRAST IMPRESSION:  1. Endotracheal  intubation. Visible soft tissues of the pharynx and larynx are unremarkable. 2. Gas dissecting within the soft tissue planes of the deep neck, within the prevertebral space, parapharyngeal space, and carotid spaces. 3. Pneumomediastinum more completely characterized on CT chest.  08/09/22: CT CHEST IMPRESSION: Extensive pneumomediastinum tracking superiorly into the neck and inferiorly into the abdomen where there is extensive pneumatosis in the stomach wall, small amount of free air adjacent to the liver, and extensive gas presumably in the retroperitoneum. Pneumatosis also noted in the esophageal wall, best seen in the neck and upper chest but also just above the diaphragm. This is presumably from recent EGD and esophageal stretching.  SIGNIFICANT EVENTS 2/3: Presented with choking incident not relieved with glucagon, patient taken urgently to the OR for endoscopy. Concerns for intraprocedure esophageal perforation. Patient remained intubated and transferred to the ICU pending CT. PCCM consulted  2/4: CT chest/soft tissue neck showed extensive pneumomediastinum, pneumatosis and pneumo peritoneum.  Discussed finding with CT surgery on-call at both [facility] and [place].  Do currently at capacity.  Patient accepted at [facility] by CT surgeon Dr. Laverta Baltimore.  Pending transfer to [place]  None  ANTIBIOTICS Zosyn 2/4> Fluconazole 2/4>  CONSULTS GI CT surgery  TUBES / LINES 2/4: Right IJ central line 2/4: Right radial art line   Discharge Exam: GENERAL:42 year-old critically ill patient lying in the bed Intubated and Sedated EYES: Pupils equal, round, reactive to light and accommodation. No scleral icterus. Extraocular muscles intact.  HEENT: Head atraumatic, normocephalic. Oropharynx and nasopharynx clear.  NECK:  Supple, no jugular venous distention. No thyroid enlargement, no tenderness.  LUNGS: Decreased breath sounds bilaterally, no wheezing, rales, rhonchi or crepitation. No use of  accessory muscles of respiration.  CARDIOVASCULAR: S1, S2 normal. No murmurs, rubs, or gallops.  ABDOMEN: Soft, nontender, nondistended. Bowel sounds hyperactive No organomegaly or mass.  EXTREMITIES:atraumatic. No swelling or erythema. Unable to assess Muscle strength is 5/5 bilaterally. Capillary refill > 3 seconds in all extremities. Pulses palpable distally. NEUROLOGIC:The patient is  Intubated and sedated. UTA Motor function.  Withdraws extremities to noxious stimuli. Cranial nerves are intact. Gait deferred.  SKIN: No obvious rash, lesion, or ulcer.   Vitals:   08/08/22 2250 08/08/22 2300 08/09/22 0000 08/09/22 0100  BP:  92/74 (!) 96/54 95/62  Pulse:  91 62 (!) 57  Resp:  16 20 18   Temp:  (!) 96.8 F (36 C)    TempSrc:  Axillary    SpO2: 99% 99% 100% 100%  Weight:  77.4 kg    Height:  5\' 9"  (1.753 m)       Discharge Labs  BMET Recent Labs  Lab 08/08/22 2000  NA 138  K 3.8  CL 107  CO2 22  GLUCOSE 92  BUN 12  CREATININE 1.09  CALCIUM 8.6*    CBC Recent Labs  Lab 08/08/22 2000  HGB 14.2  HCT 43.0  WBC 8.8  PLT 237    Anti-Coagulation No results for input(s): "INR" in the last 168 hours.  Discharge Instructions     Increase activity slowly   Complete by: As directed  Allergies as of 08/09/2022   No Known Allergies      Medication List     STOP taking these medications    benzonatate 100 MG capsule Commonly known as: TESSALON   ipratropium 0.06 % nasal spray Commonly known as: ATROVENT   oseltamivir 75 MG capsule Commonly known as: TAMIFLU   promethazine-dextromethorphan 6.25-15 MG/5ML syrup Commonly known as: PROMETHAZINE-DM        Disposition: Transfer to [facility]  Discharged Condition: Riley Oconnell has met maximum benefit of inpatient care and is medically stable and cleared for discharge.  Patient is pending follow up as above.      Time spent on disposition:  Greater than 45 minutes.    Rufina Falco, DNP, CCRN, FNP-C, AGACNP-BC Acute Care & Family Nurse Practitioner  [facility]  See Amion for personal pager PCCM on call pager [phone] until 7 am

## 2022-08-09 NOTE — Procedures (Signed)
Central Venous Catheter Insertion Procedure Note  Riley Oconnell  [id number]  01/28/1981  Date:08/09/22  Time:1:32 AM   Provider Performing:Mory Herrman A Jayd Cadieux   Procedure: Insertion of Non-tunneled Central Venous Catheter(36556) with US guidance (05397)   Indication(s) Medication administration and Difficult access  Consent Unable to obtain consent due to emergent nature of procedure.  Anesthesia Topical only with 1% lidocaine   Timeout Verified patient identification, verified procedure, site/side was marked, verified correct patient position, special equipment/implants available, medications/allergies/relevant history reviewed, required imaging and test results available.  Sterile Technique Maximal sterile technique including full sterile barrier drape, hand hygiene, sterile gown, sterile gloves, mask, hair covering, sterile ultrasound probe cover (if used).  Procedure Description Area of catheter insertion was cleaned with chlorhexidine and draped in sterile fashion.  With real-time ultrasound guidance a central venous catheter was placed into the right internal jugular vein. Nonpulsatile blood flow and easy flushing noted in all ports.  The catheter was sutured in place and sterile dressing applied.  Complications/Tolerance None; patient tolerated the procedure well. Chest X-ray is ordered to verify placement for internal jugular or subclavian cannulation.   Chest x-ray is not ordered for femoral cannulation.  EBL Minimal  Specimen(s) None    Rufina Falco, DNP, CCRN, FNP-C, AGACNP-BC Acute Care & Family Nurse Practitioner  [facility]  See Amion for personal pager PCCM on call pager [phone] until 7 am

## 2022-08-09 NOTE — Care Plan (Addendum)
GI Update Note  Spoke with reading radiologist (2359 on 08/08/22) on the telephone regarding CT neck and chest results. Pneumomediastinum appreciated per read, as is gas within the soft tissue planes of the neck. Pneumatosis also appreciated within the esophagus and stomach wall. In discussion, they are not able to pinpoint source/perforation area. Gas noted around other retroperitoneal areas  PCCM actively working on transfer to facility with esophageal stenting and cardiothoracic surgery capabilities.  7482 08/09/22 Spoke with [facility]  who will be accepting patient in transfer. They have reviewed imaging as well- unclear if full thickness tear. May repeat EGD upon transfer v contrasted swallow study.  Provider has asked that we additionally start fluconazole at this time. Patient to remain intubated and sedated for transfer. Additional differential consideration remains of potential boerhaaves prior to endoscopy or additional heimlich maneuver injury outside of the hospital  Zosyn 4.5 gm q6h iv Fluconazole 800 mg iv x1 then 400mg  q24h  Patient's husband Elta Guadeloupe) at [phone] updated by phone regarding transfer  Will continue to follow while patient is in this facility  Ronne Binning, [facility]

## 2022-08-09 NOTE — Care Plan (Signed)
Patient's husband Elta Guadeloupe contacted by phone for well-being check. He reports patient should be going for esophageal stent placement today.  He appreciated phone call and was going to visit Legrand Como later today at [facility].  Ronne Binning, [facility]

## 2022-08-09 NOTE — Procedures (Signed)
Arterial Catheter Insertion Procedure Note  NECO KLING  [id number]  October 28, 1980  Date:08/09/22  Time:1:33 AM   Provider Performing: Karen Kays   Procedure: Insertion of Arterial Line [phone]) with US guidance (32992)   Indication(s) Blood pressure monitoring and/or need for frequent ABGs  Consent Unable to obtain consent due to emergent nature of procedure.  Anesthesia None  Time Out Verified patient identification, verified procedure, site/side was marked, verified correct patient position, special equipment/implants available, medications/allergies/relevant history reviewed, required imaging and test results available.  Sterile Technique Maximal sterile technique including full sterile barrier drape, hand hygiene, sterile gown, sterile gloves, mask, hair covering, sterile ultrasound probe cover (if used).  Procedure Description Area of catheter insertion was cleaned with chlorhexidine and draped in sterile fashion. With real-time ultrasound guidance an arterial catheter was placed into the right radial artery.  Appropriate arterial tracings confirmed on monitor.    Complications/Tolerance None; patient tolerated the procedure well.  EBL Minimal  Specimen(s) None   Rufina Falco, DNP, CCRN, FNP-C, AGACNP-BC Acute Care & Family Nurse Practitioner  [facility]  See Amion for personal pager PCCM on call pager [phone] until 7 am

## 2022-08-10 ENCOUNTER — Encounter: Payer: Self-pay | Admitting: Gastroenterology

## 2022-08-10 LAB — GLUCOSE, CAPILLARY: Glucose-Capillary: 93 mg/dL (ref 70–99)

## 2022-08-27 NOTE — Anesthesia Postprocedure Evaluation (Signed)
Anesthesia Post Note  Patient: Riley Oconnell  Procedure(s) Performed: ESOPHAGOGASTRODUODENOSCOPY (EGD) WITH PROPOFOL  Patient location during evaluation: Endoscopy Anesthesia Type: General Level of consciousness: awake and alert Pain management: pain level controlled Vital Signs Assessment: post-procedure vital signs reviewed and stable Respiratory status: spontaneous breathing, nonlabored ventilation, respiratory function stable and patient connected to nasal cannula oxygen Cardiovascular status: blood pressure returned to baseline and stable Postop Assessment: no apparent nausea or vomiting Anesthetic complications: no   No notable events documented.   Last Vitals:  Vitals:   08/09/22 0000 08/09/22 0100  BP: (!) 96/54 95/62  Pulse: 62 (!) 57  Resp: 20 18  Temp:    SpO2: 100% 100%    Last Pain:  Vitals:   08/08/22 2300  TempSrc: Axillary  PainSc:                  Martha Clan

## 2024-06-18 ENCOUNTER — Emergency Department (HOSPITAL_COMMUNITY)
Admission: EM | Admit: 2024-06-18 | Discharge: 2024-06-18 | Disposition: A | Discharge status: Home / Self Care | Attending: Emergency Medicine | Admitting: Emergency Medicine

## 2024-06-18 ENCOUNTER — Encounter (HOSPITAL_COMMUNITY): Payer: Self-pay

## 2024-06-18 DIAGNOSIS — J101 Influenza due to other identified influenza virus with other respiratory manifestations: Secondary | ICD-10-CM

## 2024-06-18 LAB — RESP PANEL BY RT-PCR (RSV, FLU A&B, COVID)  RVPGX2
Influenza A by PCR: POSITIVE — AB
Influenza B by PCR: NEGATIVE
Resp Syncytial Virus by PCR: NEGATIVE
SARS Coronavirus 2 by RT PCR: NEGATIVE

## 2024-06-18 MED ORDER — ACETAMINOPHEN 325 MG PO TABS: 650.0000 mg | ORAL_TABLET | Freq: Once | ORAL | Status: DC | PRN

## 2024-06-18 MED ORDER — PROMETHAZINE-DM 6.25-15 MG/5ML PO SYRP: 5.0000 mL | ORAL_SOLUTION | Freq: Four times a day (QID) | ORAL | 0 refills | Status: DC | PRN

## 2024-06-18 MED ORDER — PROMETHAZINE-DM 6.25-15 MG/5ML PO SYRP: 5.0000 mL | ORAL_SOLUTION | Freq: Four times a day (QID) | ORAL | 0 refills | Status: AC | PRN

## 2024-06-18 MED ORDER — OSELTAMIVIR PHOSPHATE 75 MG PO CAPS: 75.0000 mg | ORAL_CAPSULE | Freq: Two times a day (BID) | ORAL | 0 refills | Status: AC

## 2024-06-18 MED ORDER — FLUTICASONE PROPIONATE 50 MCG/ACT NA SUSP: 2.0000 | Freq: Every day | NASAL | 0 refills | Status: DC

## 2024-06-18 MED ORDER — OSELTAMIVIR PHOSPHATE 75 MG PO CAPS: 75.0000 mg | ORAL_CAPSULE | Freq: Two times a day (BID) | ORAL | 0 refills | Status: DC

## 2024-06-18 MED ORDER — IBUPROFEN 400 MG PO TABS
600.0000 mg | ORAL_TABLET | Freq: Once | ORAL | Status: AC
  Administered 2024-06-18: 600 mg via ORAL
  Filled 2024-06-18: qty 2

## 2024-06-18 MED ORDER — FLUTICASONE PROPIONATE 50 MCG/ACT NA SUSP: 2.0000 | Freq: Every day | NASAL | 0 refills | Status: AC

## 2024-06-18 NOTE — Discharge Instructions (Addendum)
 Please follow-up closely with your primary care doctor on an outpatient basis.  Return to emergency department immediately for any new or worsening symptoms. You may return to work 24 hours fever free.  [facility] Primary Care Doctor List    Rollene Pesa, MD. Specialty: [facility] Medicine Contact information: [address]  [place] [address]  [phone]   Glendia Fielding, MD. Specialty: [facility] Medicine Contact information: [address]  [place] [address]  [phone]   Benita Outhouse, MD Specialty: Internal Medicine Contact information: [address] [place] [address]  [phone]   Darlyn Hurst, MD. Specialty: Internal Medicine Contact information: [address]  [place] [address]  [phone]    [facility] (Dr. Luke) Specialty: Family Medicine Contact information: [address]  [place] [address]  [phone]   Garnette Lolling, MD. Specialty: [facility] Contact information: [address]  [address]  [place] [address]  [phone]   Gaither Langton, MD. Specialty: Internal Medicine Contact information: [address]  [address]  [place] [address]  [phone]   [facility] Family Medicine: [address] [phone]  [place], Family medicine [address]  [phone]  [facility] [address] [place], [place] [phone]  [facility]: [address] [phone]    [facility] - [facility] Eval[facility] Bernardino  [address] [place], [place] [address] [phone]  Services [facility] offers a variety of basic health services.  Services include but are not limited to: Blood pressure checks  Heart rate checks  Blood sugar checks  Urine analysis  Rapid strep tests  Pregnancy tests.  Health education and referrals  People needing more complex services will be directed to a physician online.  Using these virtual visits, doctors can evaluate and prescribe medicine and treatments. There will be no medication on-site, though [facility] will help patients fill their prescriptions at little to no cost.   For More information please go to: [email or web address]  Allergy and Asthma:    [address] [place] [phone]  Urology:  [address]  [place] [phone]  [facility]  [address] [place], [place] [phone]  Orthopedics   [address] [place], [place] [phone]  Endocrinology  [address] [place], [place] [phone]  Podiatry: [facility] [phone]

## 2024-06-18 NOTE — ED Triage Notes (Signed)
 Pt c/o cough, nasal congestion, fever, and headache starting last night.  Pain score 7/10.  Pt reports he hasn't taken anything since this morning.

## 2024-06-18 NOTE — ED Provider Notes (Addendum)
  EMERGENCY DEPARTMENT AT [facility] Provider Note   CSN: [id number] Arrival date & time: 06/18/24  1649     Patient presents with: Cough, Fever, and Nasal Congestion   Riley Oconnell is a 43 y.o. male.   Patient is a 43 year old male who presents emergency department the chief complaint of cough, nasal congestion, fever, body aches which has been ongoing since last night.  Patient does note that he has had numerous exposure to other individuals who have been sick with similar symptoms.  He denies any associate abdominal pain, nausea, vomiting, diarrhea.  He has had no associated chest pain or shortness of breath.   Cough Associated symptoms: fever   Fever Associated symptoms: cough        Prior to Admission medications  Not on File    Allergies: Patient has no known allergies.    Review of Systems  Constitutional:  Positive for fever.  Respiratory:  Positive for cough.   All other systems reviewed and are negative.   Updated Vital Signs BP (!) 145/89   Pulse (!) 111   Temp (!) 102.5 F (39.2 C) (Oral)   Resp 18   Ht 5' 9 (1.753 m)   Wt 79.4 kg   SpO2 99%   BMI 25.84 kg/m   Physical Exam Vitals and nursing note reviewed.  Constitutional:      General: He is not in acute distress.    Appearance: Normal appearance. He is not ill-appearing.  HENT:     Head: Normocephalic and atraumatic.     Right Ear: Tympanic membrane, ear canal and external ear normal.     Left Ear: Tympanic membrane, ear canal and external ear normal.     Nose: Nose normal. No congestion or rhinorrhea.     Mouth/Throat:     Mouth: Mucous membranes are moist.     Pharynx: Posterior oropharyngeal erythema present. No oropharyngeal exudate.  Eyes:     Extraocular Movements: Extraocular movements intact.     Conjunctiva/sclera: Conjunctivae normal.     Pupils: Pupils are equal, round, and reactive to light.  Cardiovascular:     Rate and Rhythm: Normal rate and  regular rhythm.     Pulses: Normal pulses.     Heart sounds: Normal heart sounds. No murmur heard.    No gallop.  Pulmonary:     Effort: Pulmonary effort is normal. No respiratory distress.     Breath sounds: Normal breath sounds. No stridor. No wheezing, rhonchi or rales.  Abdominal:     General: Abdomen is flat. Bowel sounds are normal. There is no distension.     Palpations: Abdomen is soft.     Tenderness: There is no abdominal tenderness. There is no guarding.  Musculoskeletal:        General: Normal range of motion.     Cervical back: Normal range of motion and neck supple.  Skin:    General: Skin is warm and dry.  Neurological:     General: No focal deficit present.     Mental Status: He is alert and oriented to person, place, and time. Mental status is at baseline.  Psychiatric:        Mood and Affect: Mood normal.        Behavior: Behavior normal.        Thought Content: Thought content normal.        Judgment: Judgment normal.     (all labs ordered are listed, but only abnormal results  are displayed) Labs Reviewed  RESP PANEL BY RT-PCR (RSV, FLU A&B, COVID)  [id number]    EKG: None  Radiology: No results found.   Procedures   Medications Ordered in the ED  ibuprofen  (ADVIL ) tablet 600 mg (600 mg Oral Given 06/18/24 1712)                                    Medical Decision Making Risk Prescription drug management.   This patient presents to the ED for concern of cough, nasal congestion, fever, headache differential diagnosis includes influenza, COVID-19, pneumonia, acute viral syndrome    Additional history obtained:  Additional history obtained from none External records from outside source obtained and reviewed including none   Lab Tests:  I Ordered, and personally interpreted labs.  The pertinent results include: Positive influenza A    Medicines ordered and prescription drug management:  I ordered medication including Motrin  for fever  and headache Reevaluation of the patient after these medicines showed that the patient improved I have reviewed the patients home medicines and have made adjustments as needed   Problem List / ED Course:  Patient is doing well at this time and is stable for discharge home.  Heart rate is currently 98 at this time and O2 saturations are 99% on room air.  He has no indication for admission at this point as he has no associated hypoxia and is positive for influenza A.  Discussion was had with patient regarding side effects and risk versus benefit of Tamiflu  and he would like to receive this at this time.  Discussed with patient that continue treatment of his headaches on an outpatient basis.  Educated patient on the appropriate amount of Tylenol  and Motrin  to take at home for his symptoms.  Patient had lung sounds that were clear to auscultation bilaterally and do not suspect pneumonia.  Close follow-up with primary care doctor was discussed as well as strict turn precautions for any new or worsening symptoms.  Patient voiced understanding to the plan and had no additional questions.   Social Determinants of Health:  None        Final diagnoses:  None    ED Discharge Orders     None          Daralene Lonni BIRCH, PA-C 06/18/24 1842    Daralene Lonni BIRCH, PA-C 06/18/24 1842    Cleotilde Rogue, MD 06/18/24 2217
